# Patient Record
Sex: Female | Born: 1955
Health system: Southern US, Community
[De-identification: ages and names within clinical notes are randomized; demographics above are authoritative.]

## PROBLEM LIST (undated history)

## (undated) DIAGNOSIS — Z9889 Other specified postprocedural states: Secondary | ICD-10-CM

## (undated) DIAGNOSIS — N6019 Diffuse cystic mastopathy of unspecified breast: Secondary | ICD-10-CM

## (undated) DIAGNOSIS — M1990 Unspecified osteoarthritis, unspecified site: Secondary | ICD-10-CM

## (undated) DIAGNOSIS — E119 Type 2 diabetes mellitus without complications: Secondary | ICD-10-CM

## (undated) DIAGNOSIS — I219 Acute myocardial infarction, unspecified: Secondary | ICD-10-CM

## (undated) DIAGNOSIS — R112 Nausea with vomiting, unspecified: Secondary | ICD-10-CM

## (undated) DIAGNOSIS — E079 Disorder of thyroid, unspecified: Secondary | ICD-10-CM

## (undated) HISTORY — DX: Unspecified osteoarthritis, unspecified site: M19.90

## (undated) HISTORY — DX: Type 2 diabetes mellitus without complications: E11.9

## (undated) HISTORY — DX: Diffuse cystic mastopathy of unspecified breast: N60.19

## (undated) HISTORY — PX: UPPER GI ENDOSCOPY: SHX6162

## (undated) HISTORY — PX: CARPAL TUNNEL RELEASE: SHX101

## (undated) HISTORY — PX: EYE SURGERY: SHX253

## (undated) HISTORY — DX: Acute myocardial infarction, unspecified: I21.9

## (undated) HISTORY — DX: Disorder of thyroid, unspecified: E07.9

## (undated) HISTORY — PX: APPENDECTOMY: SHX54

## (undated) HISTORY — PX: SHOULDER SURGERY: SHX246

---

## 1990-01-24 HISTORY — PX: ABDOMINAL HYSTERECTOMY: SHX81

## 1990-01-24 HISTORY — PX: BREAST BIOPSY: SHX20

## 1993-01-24 HISTORY — PX: CORONARY ARTERY BYPASS GRAFT: SHX141

## 2003-12-11 ENCOUNTER — Ambulatory Visit: Payer: Self-pay | Admitting: Internal Medicine

## 2004-02-24 ENCOUNTER — Ambulatory Visit: Payer: Self-pay | Admitting: General Surgery

## 2004-03-17 ENCOUNTER — Ambulatory Visit: Payer: Self-pay | Admitting: Internal Medicine

## 2004-07-13 ENCOUNTER — Ambulatory Visit: Payer: Self-pay | Admitting: Internal Medicine

## 2004-10-06 ENCOUNTER — Ambulatory Visit: Payer: Self-pay | Admitting: Internal Medicine

## 2004-12-29 ENCOUNTER — Ambulatory Visit: Payer: Self-pay | Admitting: Internal Medicine

## 2005-03-29 ENCOUNTER — Ambulatory Visit: Payer: Self-pay | Admitting: Internal Medicine

## 2005-03-29 ENCOUNTER — Ambulatory Visit: Payer: Self-pay | Admitting: General Surgery

## 2006-01-24 DIAGNOSIS — E079 Disorder of thyroid, unspecified: Secondary | ICD-10-CM

## 2006-01-24 HISTORY — DX: Disorder of thyroid, unspecified: E07.9

## 2006-04-04 ENCOUNTER — Ambulatory Visit: Payer: Self-pay | Admitting: Specialist

## 2006-04-04 ENCOUNTER — Ambulatory Visit: Payer: Self-pay | Admitting: General Surgery

## 2007-04-16 ENCOUNTER — Ambulatory Visit: Payer: Self-pay | Admitting: General Surgery

## 2007-11-28 ENCOUNTER — Ambulatory Visit: Payer: Self-pay | Admitting: Specialist

## 2008-01-07 ENCOUNTER — Encounter: Admission: RE | Admit: 2008-01-07 | Discharge: 2008-01-07 | Payer: Self-pay | Admitting: Neurological Surgery

## 2008-01-25 HISTORY — PX: CARDIAC CATHETERIZATION: SHX172

## 2008-01-25 HISTORY — PX: OTHER SURGICAL HISTORY: SHX169

## 2008-01-25 HISTORY — PX: COLONOSCOPY: SHX174

## 2008-01-31 ENCOUNTER — Inpatient Hospital Stay (HOSPITAL_COMMUNITY): Admission: RE | Admit: 2008-01-31 | Discharge: 2008-02-04 | Payer: Self-pay | Admitting: Neurological Surgery

## 2008-02-26 ENCOUNTER — Encounter: Admission: RE | Admit: 2008-02-26 | Discharge: 2008-02-26 | Payer: Self-pay | Admitting: Neurological Surgery

## 2008-03-14 ENCOUNTER — Ambulatory Visit (HOSPITAL_COMMUNITY): Admission: RE | Admit: 2008-03-14 | Discharge: 2008-03-15 | Payer: Self-pay | Admitting: Neurological Surgery

## 2008-04-18 ENCOUNTER — Ambulatory Visit: Payer: Self-pay | Admitting: General Surgery

## 2008-05-06 ENCOUNTER — Encounter: Admission: RE | Admit: 2008-05-06 | Discharge: 2008-05-06 | Payer: Self-pay | Admitting: Neurological Surgery

## 2008-05-23 ENCOUNTER — Ambulatory Visit: Payer: Self-pay | Admitting: Cardiovascular Disease

## 2008-08-04 ENCOUNTER — Encounter: Admission: RE | Admit: 2008-08-04 | Discharge: 2008-08-04 | Payer: Self-pay | Admitting: Neurological Surgery

## 2008-11-04 ENCOUNTER — Encounter: Admission: RE | Admit: 2008-11-04 | Discharge: 2008-11-04 | Payer: Self-pay | Admitting: Neurological Surgery

## 2008-12-26 ENCOUNTER — Encounter: Admission: RE | Admit: 2008-12-26 | Discharge: 2008-12-26 | Payer: Self-pay | Admitting: Neurological Surgery

## 2009-01-24 HISTORY — PX: OTHER SURGICAL HISTORY: SHX169

## 2009-01-29 ENCOUNTER — Ambulatory Visit (HOSPITAL_COMMUNITY): Admission: RE | Admit: 2009-01-29 | Discharge: 2009-01-30 | Payer: Self-pay | Admitting: Neurological Surgery

## 2009-01-29 ENCOUNTER — Encounter (INDEPENDENT_AMBULATORY_CARE_PROVIDER_SITE_OTHER): Payer: Self-pay | Admitting: Neurological Surgery

## 2009-05-01 ENCOUNTER — Ambulatory Visit: Payer: Self-pay | Admitting: General Surgery

## 2009-06-20 IMAGING — CR DG LUMBAR SPINE 2-3V
3 series · 3 of 3 positions shown · non-contrast
Comparison: 01/31/2008

CLINICAL DATA: Lumbar fusion

LUMBAR SPINE - 2-3 VIEW

[view not recorded (1 of 3)]
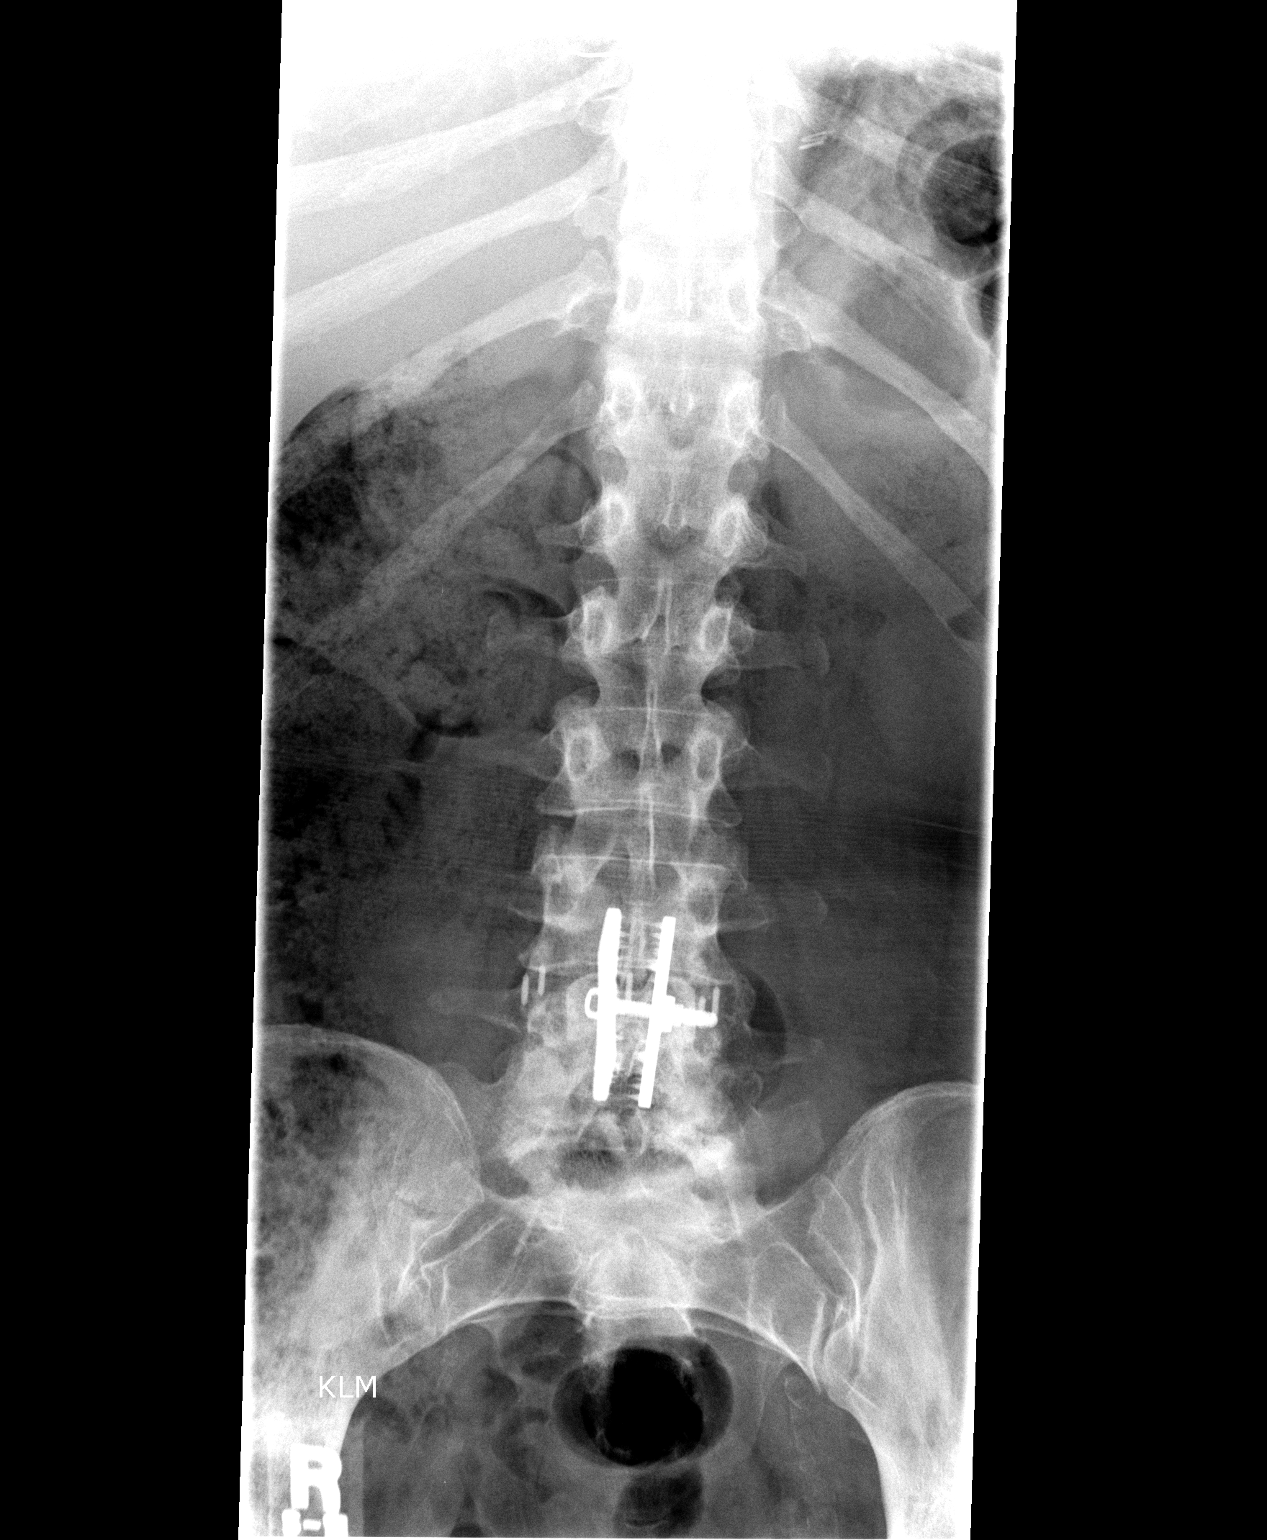

[view not recorded (2 of 3)]
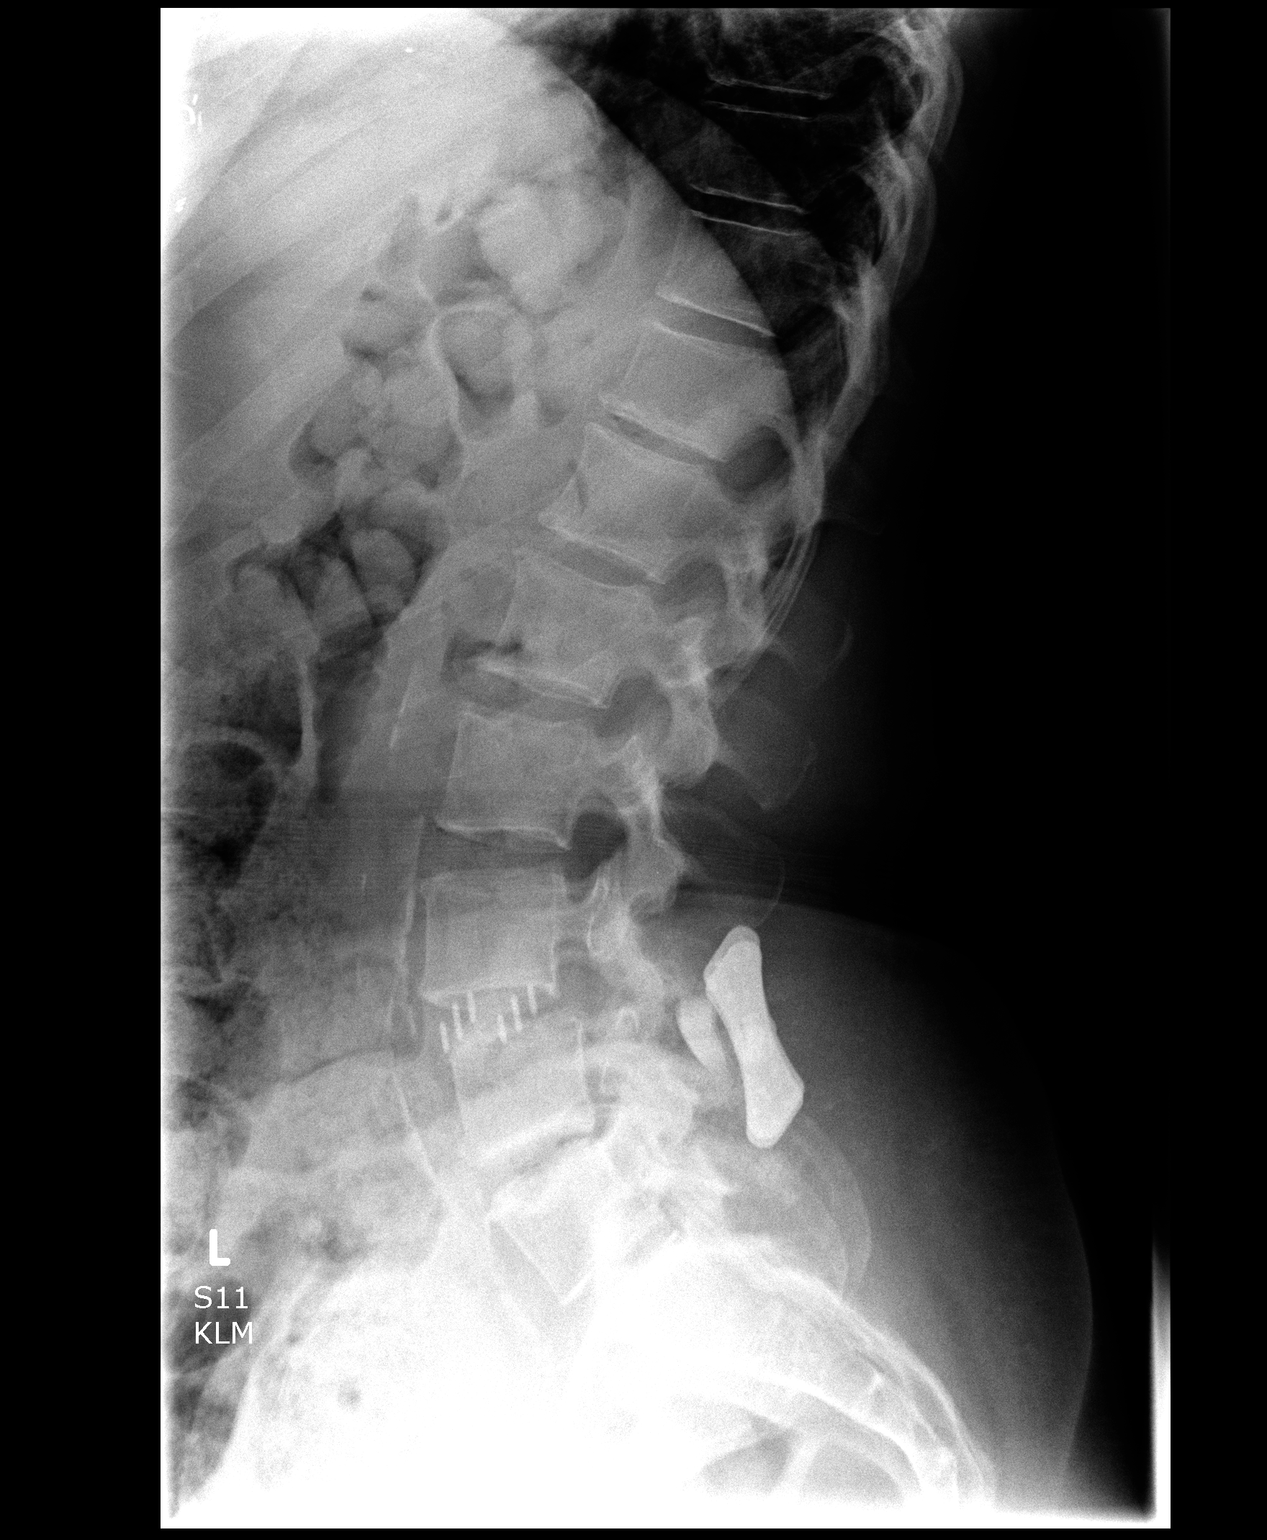

[view not recorded (3 of 3)]
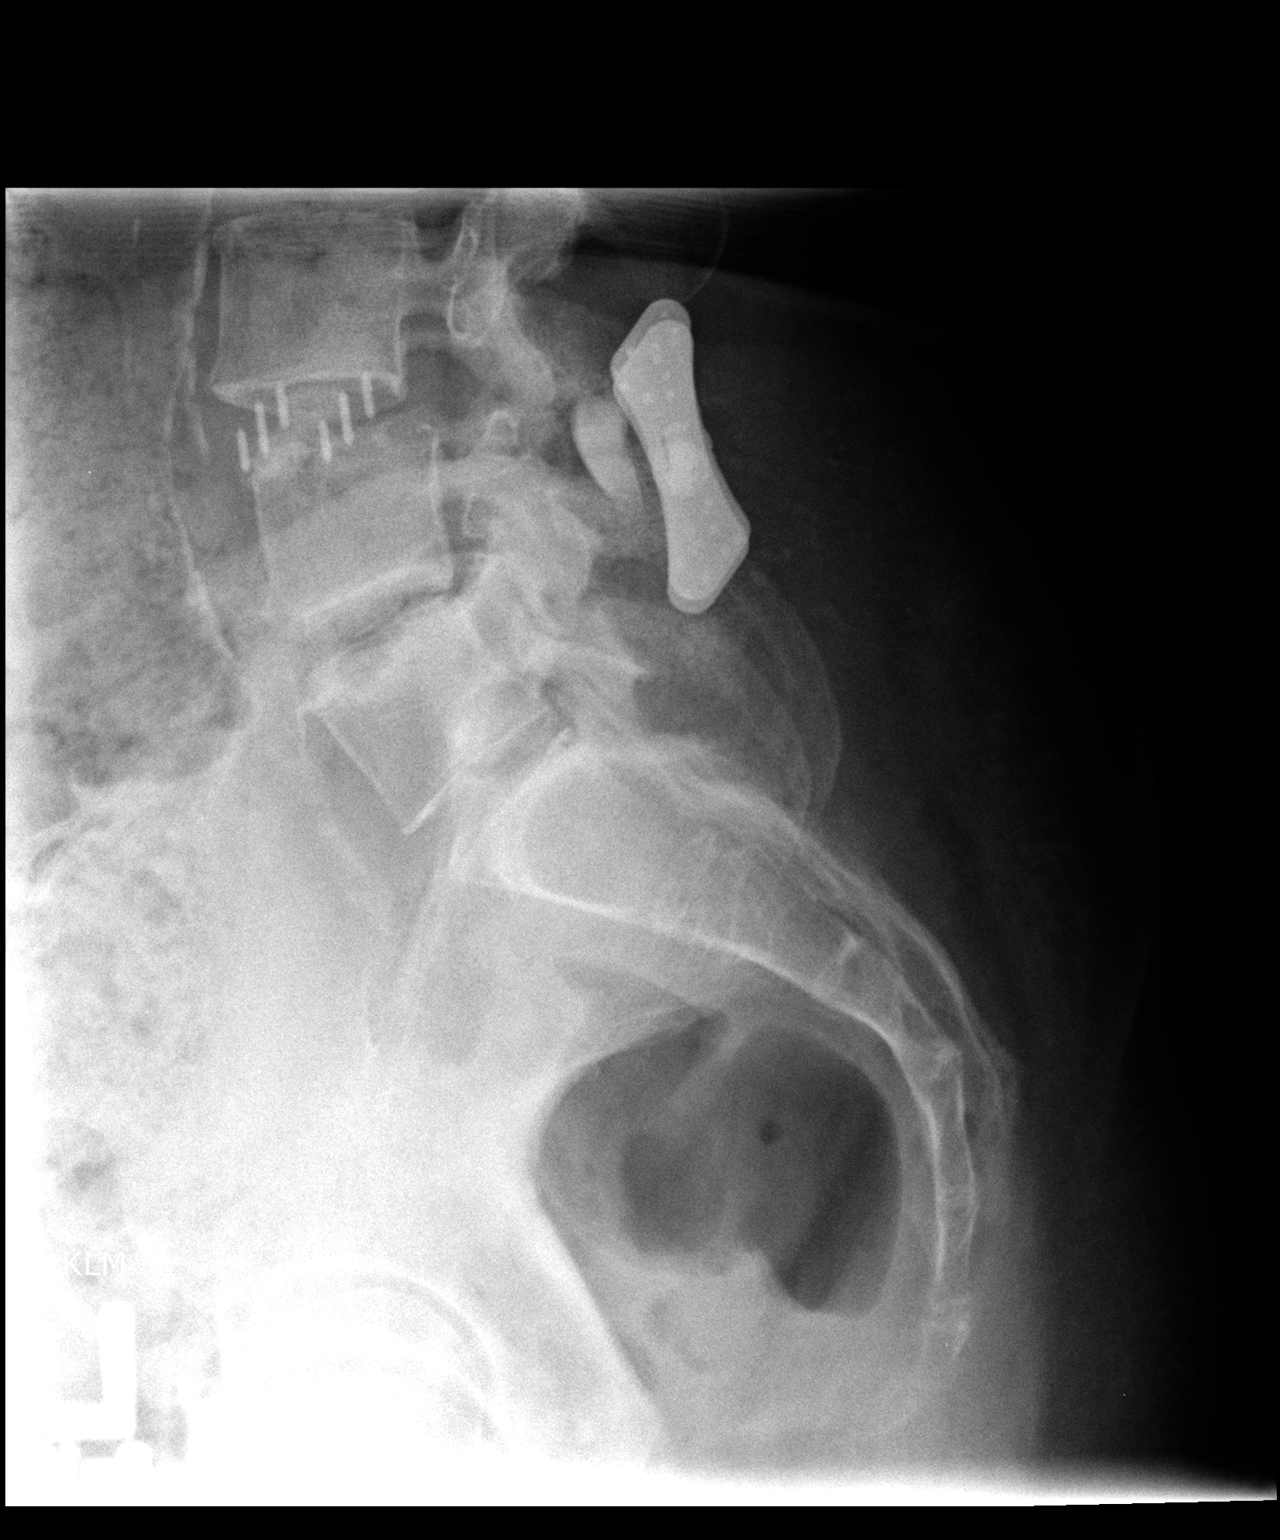

[3 of 3 positions shown; findings below may reference images not displayed]

FINDINGS: X-stop device is present between the spinous processes at
L4-5. Interbody fusion material appears satisfactory position at L3-
4.  Minimal anterolisthesis of L3 on L4.
IMPRESSION: Satisfactory position and alignment of the X-stop device and
interbody fusion material/spacers.

REF:G3 DICTATED: 02/26/2008 [DATE]

## 2010-04-11 LAB — BASIC METABOLIC PANEL
Calcium: 9.8 mg/dL (ref 8.4–10.5)
Chloride: 103 mEq/L (ref 96–112)
Creatinine, Ser: 1.14 mg/dL (ref 0.4–1.2)
GFR calc non Af Amer: 49 mL/min — ABNORMAL LOW (ref >60)
Glucose, Bld: 161 mg/dL — ABNORMAL HIGH (ref 70–99)
Potassium: 5.4 mEq/L — ABNORMAL HIGH (ref 3.5–5.1)

## 2010-04-11 LAB — CBC
RBC: 3.88 MIL/uL (ref 3.87–5.11)
RDW: 13.7 % (ref 11.5–15.5)
WBC: 6.6 10*3/uL (ref 4.0–10.5)

## 2010-04-11 LAB — DIFFERENTIAL
Basophils Relative: 1 % (ref 0–1)
Eosinophils Relative: 3 % (ref 0–5)
Lymphocytes Relative: 25 % (ref 12–46)
Lymphs Abs: 1.6 10*3/uL (ref 0.7–4.0)
Monocytes Relative: 8 % (ref 3–12)

## 2010-04-11 LAB — GLUCOSE, CAPILLARY
Glucose-Capillary: 245 mg/dL — ABNORMAL HIGH (ref 70–99)
Glucose-Capillary: 287 mg/dL — ABNORMAL HIGH (ref 70–99)
Glucose-Capillary: 291 mg/dL — ABNORMAL HIGH (ref 70–99)

## 2010-05-10 LAB — GLUCOSE, CAPILLARY
Glucose-Capillary: 171 mg/dL — ABNORMAL HIGH (ref 70–99)
Glucose-Capillary: 174 mg/dL — ABNORMAL HIGH (ref 70–99)
Glucose-Capillary: 183 mg/dL — ABNORMAL HIGH (ref 70–99)
Glucose-Capillary: 239 mg/dL — ABNORMAL HIGH (ref 70–99)
Glucose-Capillary: 240 mg/dL — ABNORMAL HIGH (ref 70–99)
Glucose-Capillary: 246 mg/dL — ABNORMAL HIGH (ref 70–99)
Glucose-Capillary: 278 mg/dL — ABNORMAL HIGH (ref 70–99)
Glucose-Capillary: 320 mg/dL — ABNORMAL HIGH (ref 70–99)

## 2010-05-10 LAB — POCT I-STAT 4, (NA,K, GLUC, HGB,HCT)
HCT: 38 % (ref 36.0–46.0)
Potassium: 5 mEq/L (ref 3.5–5.1)
Potassium: 5.2 mEq/L — ABNORMAL HIGH (ref 3.5–5.1)
Sodium: 134 mEq/L — ABNORMAL LOW (ref 135–145)

## 2010-05-10 LAB — ABO/RH: ABO/RH(D): O POS

## 2010-05-10 LAB — TYPE AND SCREEN: Antibody Screen: NEGATIVE

## 2010-05-11 LAB — CBC
HCT: 32.5 % — ABNORMAL LOW (ref 36.0–46.0)
Hemoglobin: 11.1 g/dL — ABNORMAL LOW (ref 12.0–15.0)
RBC: 3.48 MIL/uL — ABNORMAL LOW (ref 3.87–5.11)
WBC: 6.4 10*3/uL (ref 4.0–10.5)

## 2010-05-11 LAB — BASIC METABOLIC PANEL
GFR calc Af Amer: >60 mL/min (ref >60)
GFR calc non Af Amer: >60 mL/min (ref >60)
Glucose, Bld: 98 mg/dL (ref 70–99)
Potassium: 4.7 mEq/L (ref 3.5–5.1)
Sodium: 136 mEq/L (ref 135–145)

## 2010-05-11 LAB — WOUND CULTURE

## 2010-05-11 LAB — GLUCOSE, CAPILLARY
Glucose-Capillary: 115 mg/dL — ABNORMAL HIGH (ref 70–99)
Glucose-Capillary: 232 mg/dL — ABNORMAL HIGH (ref 70–99)
Glucose-Capillary: 283 mg/dL — ABNORMAL HIGH (ref 70–99)

## 2010-05-11 LAB — DIFFERENTIAL
Eosinophils Relative: 4 % (ref 0–5)
Lymphocytes Relative: 24 % (ref 12–46)
Lymphs Abs: 1.5 10*3/uL (ref 0.7–4.0)
Monocytes Absolute: 0.5 10*3/uL (ref 0.1–1.0)
Monocytes Relative: 8 % (ref 3–12)
Neutro Abs: 4.1 10*3/uL (ref 1.7–7.7)

## 2010-05-11 LAB — ANAEROBIC CULTURE

## 2010-06-08 NOTE — Op Note (Signed)
NAMEMIHO, MONDA NO.:  [id number]   MEDICAL RECORD NO.:  [id number]          PATIENT TYPE:  INP   LOCATION:  3172                         FACILITY:  [facility]   PHYSICIAN:  Tia Alert, MD     DATE OF BIRTH:  [birth year]   DATE OF PROCEDURE:  01/31/2008  DATE OF DISCHARGE:                               OPERATIVE REPORT   PREOPERATIVE DIAGNOSES:  1. Spondylolisthesis, L3-L4.  2. Facet arthropathy, L3-L4.  3. Lumbar spinal stenosis, L3-L4 with back and right L4 radiculopathy.  4. Degenerative disk disease, L4-L5.   POSTOPERATIVE DIAGNOSES:  1. Spondylolisthesis, L3-L4.  2. Facet arthropathy, L3-L4.  3. Lumbar spinal stenosis, L3-L4 with back and right L4 radiculopathy.  4. Degenerative disk disease, L4-L5.   PROCEDURES:  1. Anterior retroperitoneal interbody fusion, L3-L4 utilizing an 8 x      45 mm PEEK interbody cage packed with Osteocel Plus.  2. Posterior lumbar interlaminar fusion, L3-L4, L4-L5 utilizing local      autograft, Actifuse putty and Osteocel Plus.  3. Interspinous plating, L3-L4 utilizing a NuVasive plate.   SURGEON:  Tia Alert, MD   ASSISTANT:  Reinaldo Meeker, MD   ANESTHESIA:  General endotracheal.   COMPLICATIONS:  None apparent.   INDICATIONS FOR PROCEDURE:  Gail Franco is a very pleasant 55 year old  female who presented with back pain with left leg pain consistent with  an L4 radiculopathy.  She had significant facet arthropathy at L3-L4  with spinal stenosis at each level.  She had degenerative disk disease  at L4-L5.  I recommended anterior retroperitoneal interbody fusion at L3-  L4 and at L4-L5.  However, prior to the procedure, I felt that her  anatomy was not favorable for the interbody fusion at L4-L5 because of  the vasculature and because of her iliac crest.  Therefore, I  recommended a interbody fusion at L3-L4 followed by posterior  instrumentation to augment the fusion with onlay graft.  She understood  the  risks, benefits, expected outcome, and wished to proceed.   DESCRIPTION OF PROCEDURE:  The patient was taken to the operating room  and after induction of adequate generalized endotracheal anesthesia, she  was placed in a lateral decubitus position and taped into position.  We  checked this with AP and lateral fluoroscopy and then prepped her left  side and back with DuraPrep and draped in the usual sterile fashion.  We  used a lateral fluoroscopy to mark the disk space and then made two  incisions to approach the spine from the lateral trajectory.  We used  finger dissection through the more medial incision to go through the  outer fascia, the oblique musculature, then the inner fascia into the  retroperitoneal space.  We then swept this space until we could feel the  rib above and the transverse process.  We then used our first dilator  and passed it with our finger down to the psoas musculature and then  connected this to continuous EMG monitoring.  We had excellent readings  with that, all greater than 20 mA.  We  passed the first dilator through  the psoas musculature and checked this with AP and lateral fluoroscopy.  We then used sequential dilation continuing the use, continuous EMG  monitoring until our final retractor was placed.  We then opened the  final retractor to expose the disk space laterally.  We then continued  EMG monitoring to assure there were no neurovascular structures in our  exposed area.  A shim was then tapped into position and the disk space  was incised and the initial diskectomy was done with pituitary rongeurs.  I then used Cobbs, rotating cutter, and scrapers to prepare the  endplates.  The annulus was released on the opposite side and then an 8-  mm trial was used.  We felt this was adequate.  We tried the 10 mm, but  it seemed to be over-distracting the disk space.  So, we stayed with the  8 mm and used an 8 mm x 45 mm PEEK interbody cage.  We packed this  with  Osteocel Plus and tapped this in position at L3-L4.  We then checked  this with AP and lateral fluoroscopy and it appeared to be in good  position.  Therefore, the retractor was removed.  The wound was  irrigated and the wounds were closed with layers of 2-0 and 3-0 Vicryl  and then the skin was closed with benzoin and Steri-Strips.  All of the  drapes were removed.  We then rolled the patient into the prone position  and her lumbar region was prepped with DuraPrep and draped in usual  sterile fashion.  A 5 mL of local anesthesia was injected and a dorsal  midline incision was made and carried down to the lumbosacral fascia.  The fascia was opened and the paraspinous musculature was taken down in  a subperiosteal fashion to expose L3-L4 and L4-L5.  I removed the  interspinous ligament at L3-L4.  This had already been disrupted by her  disease process.  I performed an intertransverse fusion with a H2  allograft and then decorticated the lamina of L3, L4, and L5 and placed  a mixture of local autograft Actifuse and Osteocel Plus to perform an  interlaminar fusion at L3-L5.  I then placed an interspinous plate at L3-  L4.  I wanted to stack a plate at Z6-X0, but her spinous processes were  small and therefore the surface area of the spinous process would not  take two plates.  Therefore, I tightened the first plate at R6-E4.  We  checked this with AP and lateral fluoroscopy.  It appeared to be in good  position.  Therefore, I irrigated with saline solution and then closed  the fascia with 0-Vicryl, closed the subcutaneous and subcuticular  tissue with 2-0 and 3-0 Vicryl, and closed the skin with benzoin and  Steri-Strips.  The drapes were removed.  A sterile dressing was applied.  The patient was awakened from general anesthesia and transported to the  recovery room in stable condition.  At the end of the procedure, all  sponge, needle, and instrument counts were correct.       Tia Alert, MD  Electronically Signed     DSJ/MEDQ  D:  01/31/2008  T:  02/01/2008  Job:  [id number]

## 2010-06-08 NOTE — Op Note (Signed)
NAMESHARIAH, ASSAD NO.:  [id number]   MEDICAL RECORD NO.:  [id number]          PATIENT TYPE:  OIB   LOCATION:  3526                         FACILITY:  [facility]   PHYSICIAN:  Tia Alert, MD     DATE OF BIRTH:  [birth year]   DATE OF PROCEDURE:  03/14/2008  DATE OF DISCHARGE:                               OPERATIVE REPORT   PREOPERATIVE DIAGNOSIS:  Superficial wound infection.   POSTOPERATIVE DIAGNOSIS:  Superficial wound infection.   PROCEDURE:  Incision and drainage with irrigation and debridement of  lumbar wound.   SURGEON:  Tia Alert, MD   ANESTHESIA:  General endotracheal.   COMPLICATIONS:  None apparent.   INDICATIONS FOR THE PROCEDURE:  Ms. Shipp is a very pleasant 55-year-  old female who underwent a lumbar spine surgery about a month ago.  She  had some early drainage from her wound, was felt to potentially have a  superficial lumbar wound infection, was treated with doxycycline with  the drainage subsiding.  The wound looked fine about 2 days prior to her  calling me saying that she had some drainage and dehiscence of the  superficial and superior part of her wound.  Therefore, she was admitted  for irrigation, debridement and revision of the wound.  She understood  the risks, benefits, and expected outcome and wished to proceed.   DESCRIPTION OF THE PROCEDURE:  The patient was taken to the operating  room and after induction of adequate generalized endotracheal anesthesia  she was rolled into prone position on the Wilson frame and all pressure  points were padded.  Her lumbar region was prepped with DuraPrep and  draped in usual sterile fashion.  She had a soft swelling at the  superior aspect of the wound with minor dehiscence and some weeping of  fluid.  This was ellipsed out.  There was some release of purulent  drainage in the very superficial subcutaneous tissues.  These did not  appear to go subfascially.  I opened the wound about  3 inches long.  I  debrided the tissues, irrigated it with saline solution containing  bacitracin.  The wound was clean, had nice bleeding edges, did not look  grossly infected and therefore I closed the subcutaneous tissues with 2-  0 Vicryl and the subcuticular tissues with 3-0 Vicryl.  A medium Hemovac  drain was used in the suprafascial space.  The skin was closed with  benzoin and Steri-Strips.  The drapes removed.  Sterile dressing was  applied.  The patient was awakened from general anesthesia and  transferred to the recovery room in stable condition.  At the end of the  procedure, all sponge, needle and instrument counts were correct.      Tia Alert, MD  Electronically Signed    DSJ/MEDQ  D:  03/14/2008  T:  03/15/2008  Job:  [id number]

## 2010-06-11 NOTE — Discharge Summary (Signed)
NAMEEMORI, MUMME NO.:  [id number]   MEDICAL RECORD NO.:  [id number]          PATIENT TYPE:  INP   LOCATION:  3025                         FACILITY:  [facility]   PHYSICIAN:  Tia Alert, MD     DATE OF BIRTH:  13-Aug-1955   DATE OF ADMISSION:  01/31/2008  DATE OF DISCHARGE:  02/04/2008                               DISCHARGE SUMMARY   ADMITTING DIAGNOSIS:  Degenerative disease L3-4 and L4-5 with  spondylolisthesis and spinal stenosis with back and leg pain.   PROCEDURE:  Anterior retroperitoneal interbody fusion L3-4 with  interspinous plating and only fusion L3-L5 posteriorly.   BRIEF HISTORY OF PRESENT ILLNESS:  Ms. Salak is a very pleasant 55-year-  old female who presented with severe back pain with leg pain.  She had  an MRI and plain films which showed degenerative disease at L3-4 and L4-  5 with a grade 1 anterior listhesis of L3 and L4 with some lateral  recess stenosis at L3-4 and L4-5.  I recommended anterior  retroperitoneal interbody fusion with instrumentation.  She understood  the risks, benefits, expected outcome, and wished to proceed.   HOSPITAL COURSE:  The patient was admitted on January 31, 2008, and taken  to the operating room where she underwent an anterior retroperitoneal  interbody fusion L3-4 with interspinous plating and only fusion at L3-L5  posteriorly.  The patient tolerated the procedure well and was taken to  the recovery room and then to the floor in stable condition.  For  details of the operative procedure, please see the dictated operative  note.  The patient's hospital course was fairly routine.  There were no  complications.  She remained afebrile with stable vital signs.  Her  wounds remained clean, dry, and intact.  She was a little slow to  mobilize from soreness in her back.  She did have some anterior thigh  numbness and weakness which is common after this procedure, but she was  ambulating in the halls without  difficulty.  Her pain became well  controlled off the PCA after about 72 hours.  She remained afebrile with  stable vital signs and tolerated regular diet.  She was discharged to  home in stable condition on February 04, 2008, with plans to follow up in  2 weeks.   FINAL DIAGNOSIS:  Anterior retroperitoneal interbody fusion L3-4.      Tia Alert, MD  Electronically Signed     DSJ/MEDQ  D:  04/09/2008  T:  04/10/2008  Job:  [id number]

## 2010-07-08 ENCOUNTER — Ambulatory Visit: Payer: Self-pay | Admitting: General Surgery

## 2010-10-29 LAB — BASIC METABOLIC PANEL
CO2: 27 mEq/L (ref 19–32)
Chloride: 103 mEq/L (ref 96–112)
Creatinine, Ser: 1.16 mg/dL (ref 0.4–1.2)
GFR calc Af Amer: 59 mL/min — ABNORMAL LOW (ref >60)
Potassium: 4.5 mEq/L (ref 3.5–5.1)

## 2010-10-29 LAB — DIFFERENTIAL
Basophils Relative: 0 % (ref 0–1)
Eosinophils Absolute: 0.2 10*3/uL (ref 0.0–0.7)
Eosinophils Relative: 4 % (ref 0–5)
Lymphs Abs: 1.7 10*3/uL (ref 0.7–4.0)
Monocytes Absolute: 0.6 10*3/uL (ref 0.1–1.0)
Monocytes Relative: 9 % (ref 3–12)
Neutrophils Relative %: 62 % (ref 43–77)

## 2010-10-29 LAB — CBC
HCT: 39 % (ref 36.0–46.0)
MCHC: 33.5 g/dL (ref 30.0–36.0)
MCV: 96.4 fL (ref 78.0–100.0)
RBC: 4.05 MIL/uL (ref 3.87–5.11)

## 2011-01-28 DIAGNOSIS — N183 Chronic kidney disease, stage 3 unspecified: Secondary | ICD-10-CM | POA: Diagnosis not present

## 2011-01-28 DIAGNOSIS — D649 Anemia, unspecified: Secondary | ICD-10-CM | POA: Diagnosis not present

## 2011-01-28 DIAGNOSIS — E7800 Pure hypercholesterolemia, unspecified: Secondary | ICD-10-CM | POA: Diagnosis not present

## 2011-01-28 DIAGNOSIS — G609 Hereditary and idiopathic neuropathy, unspecified: Secondary | ICD-10-CM | POA: Diagnosis not present

## 2011-01-28 DIAGNOSIS — Z79899 Other long term (current) drug therapy: Secondary | ICD-10-CM | POA: Diagnosis not present

## 2011-01-28 DIAGNOSIS — R809 Proteinuria, unspecified: Secondary | ICD-10-CM | POA: Diagnosis not present

## 2011-01-28 DIAGNOSIS — E109 Type 1 diabetes mellitus without complications: Secondary | ICD-10-CM | POA: Diagnosis not present

## 2011-01-28 DIAGNOSIS — Z794 Long term (current) use of insulin: Secondary | ICD-10-CM | POA: Diagnosis not present

## 2011-01-28 DIAGNOSIS — I2510 Atherosclerotic heart disease of native coronary artery without angina pectoris: Secondary | ICD-10-CM | POA: Diagnosis not present

## 2011-03-11 DIAGNOSIS — H2510 Age-related nuclear cataract, unspecified eye: Secondary | ICD-10-CM | POA: Diagnosis not present

## 2011-03-16 DIAGNOSIS — H2510 Age-related nuclear cataract, unspecified eye: Secondary | ICD-10-CM | POA: Diagnosis not present

## 2011-04-05 DIAGNOSIS — Z951 Presence of aortocoronary bypass graft: Secondary | ICD-10-CM | POA: Diagnosis not present

## 2011-04-05 DIAGNOSIS — E11359 Type 2 diabetes mellitus with proliferative diabetic retinopathy without macular edema: Secondary | ICD-10-CM | POA: Diagnosis not present

## 2011-04-05 DIAGNOSIS — E1139 Type 2 diabetes mellitus with other diabetic ophthalmic complication: Secondary | ICD-10-CM | POA: Diagnosis not present

## 2011-04-05 DIAGNOSIS — H539 Unspecified visual disturbance: Secondary | ICD-10-CM | POA: Diagnosis not present

## 2011-04-05 DIAGNOSIS — Z79899 Other long term (current) drug therapy: Secondary | ICD-10-CM | POA: Diagnosis not present

## 2011-04-05 DIAGNOSIS — I2510 Atherosclerotic heart disease of native coronary artery without angina pectoris: Secondary | ICD-10-CM | POA: Diagnosis not present

## 2011-04-05 DIAGNOSIS — E7800 Pure hypercholesterolemia, unspecified: Secondary | ICD-10-CM | POA: Diagnosis not present

## 2011-04-05 DIAGNOSIS — H26019 Infantile and juvenile cortical, lamellar, or zonular cataract, unspecified eye: Secondary | ICD-10-CM | POA: Diagnosis not present

## 2011-04-05 DIAGNOSIS — K219 Gastro-esophageal reflux disease without esophagitis: Secondary | ICD-10-CM | POA: Diagnosis not present

## 2011-04-05 DIAGNOSIS — H2510 Age-related nuclear cataract, unspecified eye: Secondary | ICD-10-CM | POA: Diagnosis not present

## 2011-04-05 DIAGNOSIS — H259 Unspecified age-related cataract: Secondary | ICD-10-CM | POA: Diagnosis not present

## 2011-04-15 DIAGNOSIS — E119 Type 2 diabetes mellitus without complications: Secondary | ICD-10-CM | POA: Diagnosis not present

## 2011-04-15 DIAGNOSIS — I10 Essential (primary) hypertension: Secondary | ICD-10-CM | POA: Diagnosis not present

## 2011-05-19 DIAGNOSIS — E119 Type 2 diabetes mellitus without complications: Secondary | ICD-10-CM | POA: Diagnosis not present

## 2011-05-25 DIAGNOSIS — E109 Type 1 diabetes mellitus without complications: Secondary | ICD-10-CM | POA: Diagnosis not present

## 2011-05-25 DIAGNOSIS — G609 Hereditary and idiopathic neuropathy, unspecified: Secondary | ICD-10-CM | POA: Diagnosis not present

## 2011-05-25 DIAGNOSIS — E11359 Type 2 diabetes mellitus with proliferative diabetic retinopathy without macular edema: Secondary | ICD-10-CM | POA: Diagnosis not present

## 2011-05-25 DIAGNOSIS — E1039 Type 1 diabetes mellitus with other diabetic ophthalmic complication: Secondary | ICD-10-CM | POA: Diagnosis not present

## 2011-05-25 DIAGNOSIS — Z794 Long term (current) use of insulin: Secondary | ICD-10-CM | POA: Diagnosis not present

## 2011-05-25 DIAGNOSIS — Z951 Presence of aortocoronary bypass graft: Secondary | ICD-10-CM | POA: Diagnosis not present

## 2011-05-25 DIAGNOSIS — Z79899 Other long term (current) drug therapy: Secondary | ICD-10-CM | POA: Diagnosis not present

## 2011-05-25 DIAGNOSIS — I2510 Atherosclerotic heart disease of native coronary artery without angina pectoris: Secondary | ICD-10-CM | POA: Diagnosis not present

## 2011-05-25 DIAGNOSIS — R809 Proteinuria, unspecified: Secondary | ICD-10-CM | POA: Diagnosis not present

## 2011-05-30 DIAGNOSIS — E7800 Pure hypercholesterolemia, unspecified: Secondary | ICD-10-CM | POA: Diagnosis not present

## 2011-05-30 DIAGNOSIS — E060 Acute thyroiditis: Secondary | ICD-10-CM | POA: Diagnosis not present

## 2011-07-13 ENCOUNTER — Ambulatory Visit: Payer: Self-pay | Admitting: General Surgery

## 2011-07-13 DIAGNOSIS — I209 Angina pectoris, unspecified: Secondary | ICD-10-CM | POA: Diagnosis not present

## 2011-07-13 DIAGNOSIS — N6459 Other signs and symptoms in breast: Secondary | ICD-10-CM | POA: Diagnosis not present

## 2011-07-13 DIAGNOSIS — Z1231 Encounter for screening mammogram for malignant neoplasm of breast: Secondary | ICD-10-CM | POA: Diagnosis not present

## 2011-07-13 DIAGNOSIS — I5033 Acute on chronic diastolic (congestive) heart failure: Secondary | ICD-10-CM | POA: Diagnosis not present

## 2011-07-13 DIAGNOSIS — N6019 Diffuse cystic mastopathy of unspecified breast: Secondary | ICD-10-CM | POA: Diagnosis not present

## 2011-07-13 DIAGNOSIS — I060 Rheumatic aortic stenosis: Secondary | ICD-10-CM | POA: Diagnosis not present

## 2011-07-13 DIAGNOSIS — N6489 Other specified disorders of breast: Secondary | ICD-10-CM | POA: Diagnosis not present

## 2011-08-11 DIAGNOSIS — R55 Syncope and collapse: Secondary | ICD-10-CM | POA: Diagnosis not present

## 2011-08-12 DIAGNOSIS — I209 Angina pectoris, unspecified: Secondary | ICD-10-CM | POA: Diagnosis not present

## 2011-08-12 DIAGNOSIS — R079 Chest pain, unspecified: Secondary | ICD-10-CM | POA: Diagnosis not present

## 2011-10-06 DIAGNOSIS — E11359 Type 2 diabetes mellitus with proliferative diabetic retinopathy without macular edema: Secondary | ICD-10-CM | POA: Diagnosis not present

## 2011-10-06 DIAGNOSIS — H31009 Unspecified chorioretinal scars, unspecified eye: Secondary | ICD-10-CM | POA: Diagnosis not present

## 2011-10-06 DIAGNOSIS — E1139 Type 2 diabetes mellitus with other diabetic ophthalmic complication: Secondary | ICD-10-CM | POA: Diagnosis not present

## 2011-10-06 DIAGNOSIS — H35049 Retinal micro-aneurysms, unspecified, unspecified eye: Secondary | ICD-10-CM | POA: Diagnosis not present

## 2011-11-11 DIAGNOSIS — E119 Type 2 diabetes mellitus without complications: Secondary | ICD-10-CM | POA: Diagnosis not present

## 2011-11-11 DIAGNOSIS — E109 Type 1 diabetes mellitus without complications: Secondary | ICD-10-CM | POA: Diagnosis not present

## 2011-11-11 DIAGNOSIS — E559 Vitamin D deficiency, unspecified: Secondary | ICD-10-CM | POA: Diagnosis not present

## 2011-11-11 DIAGNOSIS — I209 Angina pectoris, unspecified: Secondary | ICD-10-CM | POA: Diagnosis not present

## 2011-12-09 DIAGNOSIS — N183 Chronic kidney disease, stage 3 unspecified: Secondary | ICD-10-CM | POA: Diagnosis not present

## 2011-12-09 DIAGNOSIS — E0500 Thyrotoxicosis with diffuse goiter without thyrotoxic crisis or storm: Secondary | ICD-10-CM | POA: Diagnosis not present

## 2011-12-09 DIAGNOSIS — Z951 Presence of aortocoronary bypass graft: Secondary | ICD-10-CM | POA: Diagnosis not present

## 2011-12-09 DIAGNOSIS — E7800 Pure hypercholesterolemia, unspecified: Secondary | ICD-10-CM | POA: Diagnosis not present

## 2011-12-09 DIAGNOSIS — D649 Anemia, unspecified: Secondary | ICD-10-CM | POA: Diagnosis not present

## 2011-12-09 DIAGNOSIS — E1039 Type 1 diabetes mellitus with other diabetic ophthalmic complication: Secondary | ICD-10-CM | POA: Diagnosis not present

## 2011-12-09 DIAGNOSIS — Z794 Long term (current) use of insulin: Secondary | ICD-10-CM | POA: Diagnosis not present

## 2011-12-09 DIAGNOSIS — Z79899 Other long term (current) drug therapy: Secondary | ICD-10-CM | POA: Diagnosis not present

## 2011-12-09 DIAGNOSIS — G609 Hereditary and idiopathic neuropathy, unspecified: Secondary | ICD-10-CM | POA: Diagnosis not present

## 2011-12-09 DIAGNOSIS — E11359 Type 2 diabetes mellitus with proliferative diabetic retinopathy without macular edema: Secondary | ICD-10-CM | POA: Diagnosis not present

## 2011-12-09 DIAGNOSIS — Z23 Encounter for immunization: Secondary | ICD-10-CM | POA: Diagnosis not present

## 2011-12-09 DIAGNOSIS — E109 Type 1 diabetes mellitus without complications: Secondary | ICD-10-CM | POA: Diagnosis not present

## 2011-12-09 DIAGNOSIS — R809 Proteinuria, unspecified: Secondary | ICD-10-CM | POA: Diagnosis not present

## 2011-12-09 DIAGNOSIS — E079 Disorder of thyroid, unspecified: Secondary | ICD-10-CM | POA: Diagnosis not present

## 2011-12-09 DIAGNOSIS — I2510 Atherosclerotic heart disease of native coronary artery without angina pectoris: Secondary | ICD-10-CM | POA: Diagnosis not present

## 2012-01-27 DIAGNOSIS — E119 Type 2 diabetes mellitus without complications: Secondary | ICD-10-CM | POA: Diagnosis not present

## 2012-01-27 DIAGNOSIS — E1029 Type 1 diabetes mellitus with other diabetic kidney complication: Secondary | ICD-10-CM | POA: Diagnosis not present

## 2012-01-27 DIAGNOSIS — I5033 Acute on chronic diastolic (congestive) heart failure: Secondary | ICD-10-CM | POA: Diagnosis not present

## 2012-01-27 DIAGNOSIS — I060 Rheumatic aortic stenosis: Secondary | ICD-10-CM | POA: Diagnosis not present

## 2012-01-27 DIAGNOSIS — I209 Angina pectoris, unspecified: Secondary | ICD-10-CM | POA: Diagnosis not present

## 2012-04-05 DIAGNOSIS — H179 Unspecified corneal scar and opacity: Secondary | ICD-10-CM | POA: Diagnosis not present

## 2012-04-05 DIAGNOSIS — H31009 Unspecified chorioretinal scars, unspecified eye: Secondary | ICD-10-CM | POA: Diagnosis not present

## 2012-04-05 DIAGNOSIS — H35049 Retinal micro-aneurysms, unspecified, unspecified eye: Secondary | ICD-10-CM | POA: Diagnosis not present

## 2012-04-05 DIAGNOSIS — E11359 Type 2 diabetes mellitus with proliferative diabetic retinopathy without macular edema: Secondary | ICD-10-CM | POA: Diagnosis not present

## 2012-04-05 DIAGNOSIS — E1139 Type 2 diabetes mellitus with other diabetic ophthalmic complication: Secondary | ICD-10-CM | POA: Diagnosis not present

## 2012-05-04 DIAGNOSIS — D649 Anemia, unspecified: Secondary | ICD-10-CM | POA: Diagnosis not present

## 2012-05-04 DIAGNOSIS — I10 Essential (primary) hypertension: Secondary | ICD-10-CM | POA: Diagnosis not present

## 2012-05-04 DIAGNOSIS — E039 Hypothyroidism, unspecified: Secondary | ICD-10-CM | POA: Diagnosis not present

## 2012-05-04 DIAGNOSIS — E7800 Pure hypercholesterolemia, unspecified: Secondary | ICD-10-CM | POA: Diagnosis not present

## 2012-05-04 DIAGNOSIS — E785 Hyperlipidemia, unspecified: Secondary | ICD-10-CM | POA: Diagnosis not present

## 2012-05-04 DIAGNOSIS — E119 Type 2 diabetes mellitus without complications: Secondary | ICD-10-CM | POA: Diagnosis not present

## 2012-05-04 DIAGNOSIS — I209 Angina pectoris, unspecified: Secondary | ICD-10-CM | POA: Diagnosis not present

## 2012-05-11 ENCOUNTER — Encounter: Payer: Self-pay | Admitting: General Surgery

## 2012-05-23 DIAGNOSIS — E11359 Type 2 diabetes mellitus with proliferative diabetic retinopathy without macular edema: Secondary | ICD-10-CM | POA: Diagnosis not present

## 2012-05-23 DIAGNOSIS — E1139 Type 2 diabetes mellitus with other diabetic ophthalmic complication: Secondary | ICD-10-CM | POA: Diagnosis not present

## 2012-05-23 DIAGNOSIS — H2510 Age-related nuclear cataract, unspecified eye: Secondary | ICD-10-CM | POA: Diagnosis not present

## 2012-05-23 DIAGNOSIS — Z961 Presence of intraocular lens: Secondary | ICD-10-CM | POA: Diagnosis not present

## 2012-07-16 ENCOUNTER — Ambulatory Visit (INDEPENDENT_AMBULATORY_CARE_PROVIDER_SITE_OTHER): Payer: Medicare Other | Admitting: General Surgery

## 2012-07-16 ENCOUNTER — Encounter: Payer: Self-pay | Admitting: General Surgery

## 2012-07-16 ENCOUNTER — Ambulatory Visit: Payer: Self-pay | Admitting: General Surgery

## 2012-07-16 VITALS — BP 118/70 | HR 75 | Resp 14 | Ht 64.0 in | Wt 158.0 lb

## 2012-07-16 DIAGNOSIS — N6019 Diffuse cystic mastopathy of unspecified breast: Secondary | ICD-10-CM | POA: Diagnosis not present

## 2012-07-16 DIAGNOSIS — Z1231 Encounter for screening mammogram for malignant neoplasm of breast: Secondary | ICD-10-CM | POA: Diagnosis not present

## 2012-07-16 NOTE — Patient Instructions (Addendum)
Follow up one year bilateral scr mammo

## 2012-07-16 NOTE — Progress Notes (Signed)
Patient ID: Gail Franco, female   DOB: 26-Dec-1955, 57 y.o.   MRN: [id number]  Chief Complaint  Patient presents with  . Follow-up    mammogram    HPI Gail Franco is a 57 y.o. female here for follow up yearly mammogram done today at [facility]. Patient does self breast exams and gets  Yearly mammograms. No breast complaints at this time. Has history of FCD.  HPI  Past Medical History  Diagnosis Date  . Diffuse cystic mastopathy   . Arthritis   . Thyroid disease 2008    hyperactive  . Diabetes mellitus without complication   . Myocardial infarction     Past Surgical History  Procedure Laterality Date  . Colonoscopy  2010  . Other surgical history  2011    scar revision d/t fistula lumbar fusion  . Upper gi endoscopy  2010, 2011  . Cardiac catheterization  2010  . Lumbar fusions  2010  . Abdominal hysterectomy  1992  . Appendectomy    . Shoulder surgery    . Coronary artery bypass graft  1995  . Carpal tunnel release    . Breast biopsy  1992    History reviewed. No pertinent family history.  Social History History  Substance Use Topics  . Smoking status: Never Smoker   . Smokeless tobacco: Never Used  . Alcohol Use: No    Allergies  Allergen Reactions  . Other Itching    Steri strips     Current Outpatient Prescriptions  Medication Sig Dispense Refill  . acetaminophen (TYLENOL) 500 MG tablet Take 500 mg by mouth every 6 (six) hours as needed for pain.      Marland Kitchen atenolol (TENORMIN) 25 MG tablet Take 25 mg by mouth 2 (two) times daily.      . clopidogrel (PLAVIX) 75 MG tablet Take 75 mg by mouth daily.      . cyclobenzaprine (FLEXERIL) 10 MG tablet Take 10 mg by mouth daily.      Marland Kitchen FLUoxetine (PROZAC) 20 MG capsule Take by mouth daily.      Marland Kitchen HUMALOG 100 UNIT/ML injection       . LANTUS 100 UNIT/ML injection       . lisinopril (PRINIVIL,ZESTRIL) 2.5 MG tablet Take 2.5 mg by mouth daily.      . pantoprazole (PROTONIX) 40 MG tablet Take 40 mg by mouth daily.       Marland Kitchen topiramate (TOPAMAX) 50 MG tablet Take 50 mg by mouth 2 (two) times daily.       No current facility-administered medications for this visit.    Review of Systems Review of Systems  Constitutional: Negative.   Respiratory: Negative.   Cardiovascular: Negative.     Blood pressure 118/70, pulse 75, resp. rate 14, height 5\' 4"  (1.626 m), weight 158 lb (71.668 kg).  Physical Exam Physical Exam  Constitutional: She is oriented to person, place, and time. She appears well-developed and well-nourished.  Eyes: Conjunctivae are normal. No scleral icterus.  Neck: Neck supple.  Cardiovascular: Normal rate, regular rhythm and normal heart sounds.   Pulmonary/Chest: Effort normal and breath sounds normal. Right breast exhibits no inverted nipple, no mass, no nipple discharge, no skin change and no tenderness. Left breast exhibits no inverted nipple, no mass, no nipple discharge, no skin change and no tenderness.  Abdominal: Soft. Bowel sounds are normal.  Lymphadenopathy:    She has no cervical adenopathy.    She has no axillary adenopathy.  Neurological: She is alert  and oriented to person, place, and time.  Skin: Skin is warm and dry.    Data Reviewed Mammogram appears to be stable  Assessment    Stable breast exam with hx of cystic mastopathy     Plan    Follow up one year with bilateral scr mammogram       Blaine Hari G 07/16/2012, 7:39 PM

## 2012-07-17 ENCOUNTER — Encounter: Payer: Self-pay | Admitting: General Surgery

## 2012-07-24 ENCOUNTER — Ambulatory Visit: Payer: Self-pay | Admitting: General Surgery

## 2012-08-31 DIAGNOSIS — I10 Essential (primary) hypertension: Secondary | ICD-10-CM | POA: Diagnosis not present

## 2012-08-31 DIAGNOSIS — E669 Obesity, unspecified: Secondary | ICD-10-CM | POA: Diagnosis not present

## 2012-08-31 DIAGNOSIS — I209 Angina pectoris, unspecified: Secondary | ICD-10-CM | POA: Diagnosis not present

## 2012-08-31 DIAGNOSIS — E119 Type 2 diabetes mellitus without complications: Secondary | ICD-10-CM | POA: Diagnosis not present

## 2012-08-31 DIAGNOSIS — E785 Hyperlipidemia, unspecified: Secondary | ICD-10-CM | POA: Diagnosis not present

## 2012-08-31 DIAGNOSIS — R0602 Shortness of breath: Secondary | ICD-10-CM | POA: Diagnosis not present

## 2012-09-14 DIAGNOSIS — E7800 Pure hypercholesterolemia, unspecified: Secondary | ICD-10-CM | POA: Diagnosis not present

## 2012-09-14 DIAGNOSIS — E109 Type 1 diabetes mellitus without complications: Secondary | ICD-10-CM | POA: Diagnosis not present

## 2012-09-14 DIAGNOSIS — G609 Hereditary and idiopathic neuropathy, unspecified: Secondary | ICD-10-CM | POA: Diagnosis not present

## 2012-09-14 DIAGNOSIS — R809 Proteinuria, unspecified: Secondary | ICD-10-CM | POA: Diagnosis not present

## 2012-09-14 DIAGNOSIS — E1039 Type 1 diabetes mellitus with other diabetic ophthalmic complication: Secondary | ICD-10-CM | POA: Diagnosis not present

## 2012-09-14 DIAGNOSIS — Z79899 Other long term (current) drug therapy: Secondary | ICD-10-CM | POA: Diagnosis not present

## 2012-09-14 DIAGNOSIS — E1169 Type 2 diabetes mellitus with other specified complication: Secondary | ICD-10-CM | POA: Diagnosis not present

## 2012-09-14 DIAGNOSIS — N183 Chronic kidney disease, stage 3 unspecified: Secondary | ICD-10-CM | POA: Diagnosis not present

## 2012-09-14 DIAGNOSIS — Z951 Presence of aortocoronary bypass graft: Secondary | ICD-10-CM | POA: Diagnosis not present

## 2012-09-14 DIAGNOSIS — E11359 Type 2 diabetes mellitus with proliferative diabetic retinopathy without macular edema: Secondary | ICD-10-CM | POA: Diagnosis not present

## 2012-09-14 DIAGNOSIS — I2510 Atherosclerotic heart disease of native coronary artery without angina pectoris: Secondary | ICD-10-CM | POA: Diagnosis not present

## 2012-09-14 DIAGNOSIS — E060 Acute thyroiditis: Secondary | ICD-10-CM | POA: Diagnosis not present

## 2012-09-14 DIAGNOSIS — G5600 Carpal tunnel syndrome, unspecified upper limb: Secondary | ICD-10-CM | POA: Diagnosis not present

## 2012-09-14 DIAGNOSIS — E1069 Type 1 diabetes mellitus with other specified complication: Secondary | ICD-10-CM | POA: Diagnosis not present

## 2012-10-04 DIAGNOSIS — E11359 Type 2 diabetes mellitus with proliferative diabetic retinopathy without macular edema: Secondary | ICD-10-CM | POA: Diagnosis not present

## 2012-10-04 DIAGNOSIS — I209 Angina pectoris, unspecified: Secondary | ICD-10-CM | POA: Diagnosis not present

## 2012-10-04 DIAGNOSIS — E1039 Type 1 diabetes mellitus with other diabetic ophthalmic complication: Secondary | ICD-10-CM | POA: Diagnosis not present

## 2012-10-04 DIAGNOSIS — H35049 Retinal micro-aneurysms, unspecified, unspecified eye: Secondary | ICD-10-CM | POA: Diagnosis not present

## 2012-11-11 DIAGNOSIS — L259 Unspecified contact dermatitis, unspecified cause: Secondary | ICD-10-CM | POA: Diagnosis not present

## 2012-12-07 DIAGNOSIS — E109 Type 1 diabetes mellitus without complications: Secondary | ICD-10-CM | POA: Diagnosis not present

## 2012-12-07 DIAGNOSIS — I359 Nonrheumatic aortic valve disorder, unspecified: Secondary | ICD-10-CM | POA: Diagnosis not present

## 2012-12-31 DIAGNOSIS — H26499 Other secondary cataract, unspecified eye: Secondary | ICD-10-CM | POA: Diagnosis not present

## 2012-12-31 DIAGNOSIS — Z961 Presence of intraocular lens: Secondary | ICD-10-CM | POA: Diagnosis not present

## 2012-12-31 DIAGNOSIS — E1139 Type 2 diabetes mellitus with other diabetic ophthalmic complication: Secondary | ICD-10-CM | POA: Diagnosis not present

## 2013-03-11 DIAGNOSIS — Z951 Presence of aortocoronary bypass graft: Secondary | ICD-10-CM | POA: Diagnosis not present

## 2013-03-11 DIAGNOSIS — E1165 Type 2 diabetes mellitus with hyperglycemia: Secondary | ICD-10-CM | POA: Diagnosis not present

## 2013-03-11 DIAGNOSIS — N182 Chronic kidney disease, stage 2 (mild): Secondary | ICD-10-CM | POA: Diagnosis not present

## 2013-03-11 DIAGNOSIS — I70219 Atherosclerosis of native arteries of extremities with intermittent claudication, unspecified extremity: Secondary | ICD-10-CM | POA: Diagnosis not present

## 2013-03-11 DIAGNOSIS — N951 Menopausal and female climacteric states: Secondary | ICD-10-CM | POA: Diagnosis not present

## 2013-03-11 DIAGNOSIS — E1039 Type 1 diabetes mellitus with other diabetic ophthalmic complication: Secondary | ICD-10-CM | POA: Diagnosis not present

## 2013-03-11 DIAGNOSIS — E1129 Type 2 diabetes mellitus with other diabetic kidney complication: Secondary | ICD-10-CM | POA: Diagnosis not present

## 2013-03-11 DIAGNOSIS — G609 Hereditary and idiopathic neuropathy, unspecified: Secondary | ICD-10-CM | POA: Diagnosis not present

## 2013-03-11 DIAGNOSIS — I739 Peripheral vascular disease, unspecified: Secondary | ICD-10-CM | POA: Diagnosis not present

## 2013-03-11 DIAGNOSIS — E109 Type 1 diabetes mellitus without complications: Secondary | ICD-10-CM | POA: Diagnosis not present

## 2013-03-11 DIAGNOSIS — N058 Unspecified nephritic syndrome with other morphologic changes: Secondary | ICD-10-CM | POA: Diagnosis not present

## 2013-03-11 DIAGNOSIS — E1139 Type 2 diabetes mellitus with other diabetic ophthalmic complication: Secondary | ICD-10-CM | POA: Diagnosis not present

## 2013-03-11 DIAGNOSIS — E11359 Type 2 diabetes mellitus with proliferative diabetic retinopathy without macular edema: Secondary | ICD-10-CM | POA: Diagnosis not present

## 2013-03-11 DIAGNOSIS — E1169 Type 2 diabetes mellitus with other specified complication: Secondary | ICD-10-CM | POA: Diagnosis not present

## 2013-03-11 DIAGNOSIS — I2510 Atherosclerotic heart disease of native coronary artery without angina pectoris: Secondary | ICD-10-CM | POA: Diagnosis not present

## 2013-06-14 DIAGNOSIS — E785 Hyperlipidemia, unspecified: Secondary | ICD-10-CM | POA: Diagnosis not present

## 2013-06-14 DIAGNOSIS — M19049 Primary osteoarthritis, unspecified hand: Secondary | ICD-10-CM | POA: Diagnosis not present

## 2013-06-14 DIAGNOSIS — I060 Rheumatic aortic stenosis: Secondary | ICD-10-CM | POA: Diagnosis not present

## 2013-06-14 DIAGNOSIS — I209 Angina pectoris, unspecified: Secondary | ICD-10-CM | POA: Diagnosis not present

## 2013-06-14 DIAGNOSIS — I10 Essential (primary) hypertension: Secondary | ICD-10-CM | POA: Diagnosis not present

## 2013-07-15 DIAGNOSIS — E7800 Pure hypercholesterolemia, unspecified: Secondary | ICD-10-CM | POA: Diagnosis not present

## 2013-07-15 DIAGNOSIS — E109 Type 1 diabetes mellitus without complications: Secondary | ICD-10-CM | POA: Diagnosis not present

## 2013-07-15 DIAGNOSIS — N182 Chronic kidney disease, stage 2 (mild): Secondary | ICD-10-CM | POA: Diagnosis not present

## 2013-07-15 DIAGNOSIS — E11359 Type 2 diabetes mellitus with proliferative diabetic retinopathy without macular edema: Secondary | ICD-10-CM | POA: Diagnosis not present

## 2013-07-15 DIAGNOSIS — I2510 Atherosclerotic heart disease of native coronary artery without angina pectoris: Secondary | ICD-10-CM | POA: Diagnosis not present

## 2013-07-15 DIAGNOSIS — E1039 Type 1 diabetes mellitus with other diabetic ophthalmic complication: Secondary | ICD-10-CM | POA: Diagnosis not present

## 2013-07-15 DIAGNOSIS — G609 Hereditary and idiopathic neuropathy, unspecified: Secondary | ICD-10-CM | POA: Diagnosis not present

## 2013-07-15 DIAGNOSIS — E663 Overweight: Secondary | ICD-10-CM | POA: Diagnosis not present

## 2013-07-15 DIAGNOSIS — E1169 Type 2 diabetes mellitus with other specified complication: Secondary | ICD-10-CM | POA: Diagnosis not present

## 2013-07-15 DIAGNOSIS — E1139 Type 2 diabetes mellitus with other diabetic ophthalmic complication: Secondary | ICD-10-CM | POA: Diagnosis not present

## 2013-07-15 DIAGNOSIS — H35049 Retinal micro-aneurysms, unspecified, unspecified eye: Secondary | ICD-10-CM | POA: Diagnosis not present

## 2013-07-18 ENCOUNTER — Ambulatory Visit: Payer: Medicare Other | Admitting: General Surgery

## 2013-07-30 ENCOUNTER — Ambulatory Visit: Payer: Medicare Other | Admitting: General Surgery

## 2013-08-01 ENCOUNTER — Ambulatory Visit: Payer: Medicare Other | Admitting: General Surgery

## 2013-08-12 ENCOUNTER — Ambulatory Visit (INDEPENDENT_AMBULATORY_CARE_PROVIDER_SITE_OTHER): Payer: Medicare Other | Admitting: General Surgery

## 2013-08-12 ENCOUNTER — Other Ambulatory Visit: Payer: Medicare Other

## 2013-08-12 ENCOUNTER — Encounter: Payer: Self-pay | Admitting: General Surgery

## 2013-08-12 VITALS — BP 128/72 | HR 74 | Resp 12 | Ht 64.0 in

## 2013-08-12 DIAGNOSIS — R234 Changes in skin texture: Secondary | ICD-10-CM | POA: Diagnosis not present

## 2013-08-12 DIAGNOSIS — N63 Unspecified lump in unspecified breast: Secondary | ICD-10-CM

## 2013-08-12 DIAGNOSIS — Z1231 Encounter for screening mammogram for malignant neoplasm of breast: Secondary | ICD-10-CM | POA: Diagnosis not present

## 2013-08-12 NOTE — Patient Instructions (Addendum)
Patient to return in one month. Please call back to arrange an appointment.  Continue self breast exams. Call office for any new breast issues or concerns.

## 2013-08-12 NOTE — Progress Notes (Signed)
Patient ID: Gail IslamLynne H Hinesley, female   DOB: 09/01/1955, 58 y.o.   MRN: [id number]  Chief Complaint  Patient presents with  . Follow-up    mammogram    HPI Gail Franco is a 58 y.o. female who presents for a breast evaluation. The most recent mamm[facility] was [facility] on  08/12/13 [facility].   Patient does perform regular self breast checks and gets regular mammograms done.    HPI  Past Medical History  Diagnosis Date  . Diffuse cystic mastopathy   . Arthritis   . Thyroid disease 2008    hyperactive  . Diabetes mellitus without complication   . Myocardial infarction     Past Surgical History  Procedure Laterality Date  . Colonoscopy  2010  . Other surgical history  2011    scar revision d/t fistula lumbar fusion  . Upper gi endoscopy  2010, 2011  . Cardiac catheterization  2010  . Lumbar fusions  2010  . Abdominal hysterectomy  1992  . Appendectomy    . Shoulder surgery    . Coronary artery bypass graft  1995  . Carpal tunnel release    . Breast biopsy  1992    History reviewed. No pertinent family history.  Social History History  Substance Use Topics  . Smoking status: Never Smoker   . Smokeless tobacco: Never Used  . Alcohol Use: No    Allergies  Allergen Reactions  . Other Itching    Steri strips     Current Outpatient Prescriptions  Medication Sig Dispense Refill  . acetaminophen (TYLENOL) 500 MG tablet Take 500 mg by mouth every 6 (six) hours as needed for pain.      Marland Kitchen. atorvastatin (LIPITOR) 20 MG tablet       . carvedilol (COREG) 12.5 MG tablet Take 1 tablet by mouth daily.      . clopidogrel (PLAVIX) 75 MG tablet Take 75 mg by mouth daily.      . cyclobenzaprine (FLEXERIL) 10 MG tablet Take 10 mg by mouth daily.      Marland Kitchen. FLUoxetine (PROZAC) 20 MG capsule Take by mouth daily.      Marland Kitchen. HUMALOG 100 UNIT/ML injection       . LANTUS 100 UNIT/ML injection       . lisinopril (PRINIVIL,ZESTRIL) 2.5 MG tablet Take 2.5 mg by mouth daily.      . pantoprazole (PROTONIX)  40 MG tablet Take 40 mg by mouth daily.      Marland Kitchen. topiramate (TOPAMAX) 50 MG tablet Take 50 mg by mouth 2 (two) times daily.       No current facility-administered medications for this visit.    Review of Systems Review of Systems  Constitutional: Negative.   Respiratory: Negative.   Cardiovascular: Negative.     Blood pressure 128/72, pulse 74, resp. rate 12, height 5\' 4"  (1.626 m).  Physical Exam Physical Exam  Constitutional: She is oriented to person, place, and time. She appears well-developed and well-nourished.  Eyes: Conjunctivae are normal. No scleral icterus.  Cardiovascular: Normal rate and regular rhythm.   Murmur heard.  Systolic ( this is an old murmur.) murmur is present with a grade of 4/6  Pulmonary/Chest: Effort normal and breath sounds normal. Right breast exhibits no inverted nipple, no mass, no nipple discharge, no skin change and no tenderness. Left breast exhibits mass. Left breast exhibits no inverted nipple, no nipple discharge, no skin change and no tenderness.  Left breast thickening at 12 o'clock-2cm ridge of  firm tissue 12-1 ocl just above areola.  Abdominal: Soft. Bowel sounds are normal. There is no tenderness.  Lymphadenopathy:    She has no cervical adenopathy.    She has no axillary adenopathy.  Neurological: She is alert and oriented to person, place, and time.  Skin: Skin is warm and dry.    Data Reviewed Mammogram reviewed -no findings.  Assessment    Performed left breast ultrasound- there is an elongated hypoechoic area at site of palpable finding. This may be fibrosis but warrants a short term recheck and biopsy if it persists. Discussed fully with pt..  Plan    Patient to return in one month.     The patient wishes to call to arrange one month follow up visit. She lives out of town and would like to make appointment when she will be coming back to the area.    SANKAR,SEEPLAPUTHUR G 08/13/2013, 5:36 AM

## 2013-08-13 ENCOUNTER — Encounter: Payer: Self-pay | Admitting: General Surgery

## 2013-08-14 ENCOUNTER — Encounter: Payer: Self-pay | Admitting: General Surgery

## 2013-09-11 ENCOUNTER — Encounter: Payer: Self-pay | Admitting: General Surgery

## 2013-09-11 ENCOUNTER — Ambulatory Visit (INDEPENDENT_AMBULATORY_CARE_PROVIDER_SITE_OTHER): Payer: Self-pay | Admitting: General Surgery

## 2013-09-11 VITALS — BP 128/72 | HR 72 | Resp 12 | Ht 64.0 in | Wt 158.0 lb

## 2013-09-11 DIAGNOSIS — N183 Chronic kidney disease, stage 3 unspecified: Secondary | ICD-10-CM | POA: Diagnosis not present

## 2013-09-11 DIAGNOSIS — E119 Type 2 diabetes mellitus without complications: Secondary | ICD-10-CM | POA: Diagnosis not present

## 2013-09-11 DIAGNOSIS — R234 Changes in skin texture: Secondary | ICD-10-CM

## 2013-09-11 DIAGNOSIS — E785 Hyperlipidemia, unspecified: Secondary | ICD-10-CM | POA: Diagnosis not present

## 2013-09-11 DIAGNOSIS — I5033 Acute on chronic diastolic (congestive) heart failure: Secondary | ICD-10-CM | POA: Diagnosis not present

## 2013-09-11 DIAGNOSIS — I10 Essential (primary) hypertension: Secondary | ICD-10-CM | POA: Diagnosis not present

## 2013-09-11 DIAGNOSIS — E559 Vitamin D deficiency, unspecified: Secondary | ICD-10-CM | POA: Diagnosis not present

## 2013-09-11 DIAGNOSIS — I060 Rheumatic aortic stenosis: Secondary | ICD-10-CM | POA: Diagnosis not present

## 2013-09-11 NOTE — Progress Notes (Signed)
Patient ID: Gail Franco, female   DOB: 01/15/1956, 58 y.o.   MRN: [id number]  Chief Complaint  Patient presents with  . Follow-up    breast     HPI Gail Franco is a 58 y.o. female here today for her one month breast follow up. On her yearly exam last month a small thickening noticed in the left breast. . Patient states she is doing well. No new breast problems.  HPI  Past Medical History  Diagnosis Date  . Diffuse cystic mastopathy   . Arthritis   . Thyroid disease 2008    hyperactive  . Diabetes mellitus without complication   . Myocardial infarction     Past Surgical History  Procedure Laterality Date  . Colonoscopy  2010  . Other surgical history  2011    scar revision d/t fistula lumbar fusion  . Upper gi endoscopy  2010, 2011  . Cardiac catheterization  2010  . Lumbar fusions  2010  . Abdominal hysterectomy  1992  . Appendectomy    . Shoulder surgery    . Coronary artery bypass graft  1995  . Carpal tunnel release    . Breast biopsy  1992    History reviewed. No pertinent family history.  Social History History  Substance Use Topics  . Smoking status: Never Smoker   . Smokeless tobacco: Never Used  . Alcohol Use: No    Allergies  Allergen Reactions  . Other Itching    Steri strips     Current Outpatient Prescriptions  Medication Sig Dispense Refill  . acetaminophen (TYLENOL) 500 MG tablet Take 500 mg by mouth every 6 (six) hours as needed for pain.      . carvedilol (COREG) 12.5 MG tablet Take 1 tablet by mouth daily.      . clopidogrel (PLAVIX) 75 MG tablet Take 75 mg by mouth daily.      . cyclobenzaprine (FLEXERIL) 10 MG tablet Take 10 mg by mouth daily.      Marland Kitchen. FLUoxetine (PROZAC) 20 MG capsule Take by mouth daily.      Marland Kitchen. HUMALOG 100 UNIT/ML injection       . LANTUS 100 UNIT/ML injection       . lisinopril (PRINIVIL,ZESTRIL) 2.5 MG tablet Take 2.5 mg by mouth daily.      . pantoprazole (PROTONIX) 40 MG tablet Take 40 mg by mouth daily.       Marland Kitchen. topiramate (TOPAMAX) 50 MG tablet Take 50 mg by mouth 2 (two) times daily.       No current facility-administered medications for this visit.    Review of Systems Review of Systems  Constitutional: Negative.   Respiratory: Negative.   Cardiovascular: Negative.     Blood pressure 128/72, pulse 72, resp. rate 12, height 5\' 4"  (1.626 m), weight 158 lb (71.668 kg).  Physical Exam Physical Exam Left breast exam- complete resolution of the thick ing at the 12- 1 o'clock location. No new findings in the breast. No axillary or cervical nodes. Data Reviewed Prior notes  Assessment    Left breast mass resolved.     Plan    Return to yearly checks next follow up in July 2016 and bilateral screening mammograms.        SANKAR,SEEPLAPUTHUR G 09/12/2013, 8:18 AM

## 2013-09-11 NOTE — Patient Instructions (Signed)
Return to yearly checks next follow up in July 2016 and bilateral screening mammograms.

## 2013-09-12 ENCOUNTER — Encounter: Payer: Self-pay | Admitting: General Surgery

## 2013-11-25 ENCOUNTER — Encounter: Payer: Self-pay | Admitting: General Surgery

## 2013-12-06 DIAGNOSIS — H6122 Impacted cerumen, left ear: Secondary | ICD-10-CM | POA: Diagnosis not present

## 2013-12-06 DIAGNOSIS — J329 Chronic sinusitis, unspecified: Secondary | ICD-10-CM | POA: Diagnosis not present

## 2013-12-13 DIAGNOSIS — N182 Chronic kidney disease, stage 2 (mild): Secondary | ICD-10-CM | POA: Diagnosis not present

## 2013-12-13 DIAGNOSIS — E10649 Type 1 diabetes mellitus with hypoglycemia without coma: Secondary | ICD-10-CM | POA: Diagnosis not present

## 2013-12-13 DIAGNOSIS — E11649 Type 2 diabetes mellitus with hypoglycemia without coma: Secondary | ICD-10-CM | POA: Diagnosis not present

## 2013-12-13 DIAGNOSIS — E10359 Type 1 diabetes mellitus with proliferative diabetic retinopathy without macular edema: Secondary | ICD-10-CM | POA: Diagnosis not present

## 2013-12-13 DIAGNOSIS — G629 Polyneuropathy, unspecified: Secondary | ICD-10-CM | POA: Diagnosis not present

## 2013-12-13 DIAGNOSIS — Z794 Long term (current) use of insulin: Secondary | ICD-10-CM | POA: Diagnosis not present

## 2013-12-13 DIAGNOSIS — I25810 Atherosclerosis of coronary artery bypass graft(s) without angina pectoris: Secondary | ICD-10-CM | POA: Diagnosis not present

## 2013-12-13 DIAGNOSIS — Z7982 Long term (current) use of aspirin: Secondary | ICD-10-CM | POA: Diagnosis not present

## 2013-12-13 DIAGNOSIS — Z23 Encounter for immunization: Secondary | ICD-10-CM | POA: Diagnosis not present

## 2013-12-13 DIAGNOSIS — E780 Pure hypercholesterolemia: Secondary | ICD-10-CM | POA: Diagnosis not present

## 2013-12-25 DIAGNOSIS — E784 Other hyperlipidemia: Secondary | ICD-10-CM | POA: Diagnosis not present

## 2013-12-25 DIAGNOSIS — E119 Type 2 diabetes mellitus without complications: Secondary | ICD-10-CM | POA: Diagnosis not present

## 2013-12-25 DIAGNOSIS — N183 Chronic kidney disease, stage 3 (moderate): Secondary | ICD-10-CM | POA: Diagnosis not present

## 2013-12-25 DIAGNOSIS — I209 Angina pectoris, unspecified: Secondary | ICD-10-CM | POA: Diagnosis not present

## 2014-01-08 DIAGNOSIS — Z961 Presence of intraocular lens: Secondary | ICD-10-CM | POA: Diagnosis not present

## 2014-01-08 DIAGNOSIS — E11359 Type 2 diabetes mellitus with proliferative diabetic retinopathy without macular edema: Secondary | ICD-10-CM | POA: Diagnosis not present

## 2014-01-08 DIAGNOSIS — E860 Dehydration: Secondary | ICD-10-CM | POA: Diagnosis not present

## 2014-01-08 DIAGNOSIS — R197 Diarrhea, unspecified: Secondary | ICD-10-CM | POA: Diagnosis not present

## 2014-01-08 DIAGNOSIS — H2512 Age-related nuclear cataract, left eye: Secondary | ICD-10-CM | POA: Diagnosis not present

## 2014-01-08 DIAGNOSIS — Z9889 Other specified postprocedural states: Secondary | ICD-10-CM | POA: Diagnosis not present

## 2014-01-08 DIAGNOSIS — Z794 Long term (current) use of insulin: Secondary | ICD-10-CM | POA: Diagnosis not present

## 2014-01-08 DIAGNOSIS — H26491 Other secondary cataract, right eye: Secondary | ICD-10-CM | POA: Diagnosis not present

## 2014-01-08 DIAGNOSIS — H4311 Vitreous hemorrhage, right eye: Secondary | ICD-10-CM | POA: Diagnosis not present

## 2014-01-08 DIAGNOSIS — R109 Unspecified abdominal pain: Secondary | ICD-10-CM | POA: Diagnosis not present

## 2014-02-06 DIAGNOSIS — H26491 Other secondary cataract, right eye: Secondary | ICD-10-CM | POA: Diagnosis not present

## 2014-02-06 DIAGNOSIS — H2512 Age-related nuclear cataract, left eye: Secondary | ICD-10-CM | POA: Diagnosis not present

## 2014-02-06 DIAGNOSIS — E11359 Type 2 diabetes mellitus with proliferative diabetic retinopathy without macular edema: Secondary | ICD-10-CM | POA: Diagnosis not present

## 2014-02-06 DIAGNOSIS — Z961 Presence of intraocular lens: Secondary | ICD-10-CM | POA: Diagnosis not present

## 2014-02-06 DIAGNOSIS — Z9889 Other specified postprocedural states: Secondary | ICD-10-CM | POA: Diagnosis not present

## 2014-02-06 DIAGNOSIS — Z794 Long term (current) use of insulin: Secondary | ICD-10-CM | POA: Diagnosis not present

## 2014-02-06 DIAGNOSIS — H4311 Vitreous hemorrhage, right eye: Secondary | ICD-10-CM | POA: Diagnosis not present

## 2014-04-14 DIAGNOSIS — H4311 Vitreous hemorrhage, right eye: Secondary | ICD-10-CM | POA: Diagnosis not present

## 2014-04-14 DIAGNOSIS — H26491 Other secondary cataract, right eye: Secondary | ICD-10-CM | POA: Diagnosis not present

## 2014-04-14 DIAGNOSIS — E11359 Type 2 diabetes mellitus with proliferative diabetic retinopathy without macular edema: Secondary | ICD-10-CM | POA: Diagnosis not present

## 2014-04-14 DIAGNOSIS — E119 Type 2 diabetes mellitus without complications: Secondary | ICD-10-CM | POA: Diagnosis not present

## 2014-04-15 DIAGNOSIS — R0602 Shortness of breath: Secondary | ICD-10-CM | POA: Diagnosis not present

## 2014-04-15 DIAGNOSIS — E119 Type 2 diabetes mellitus without complications: Secondary | ICD-10-CM | POA: Diagnosis not present

## 2014-04-15 DIAGNOSIS — I10 Essential (primary) hypertension: Secondary | ICD-10-CM | POA: Diagnosis not present

## 2014-04-15 DIAGNOSIS — E668 Other obesity: Secondary | ICD-10-CM | POA: Diagnosis not present

## 2014-07-11 DIAGNOSIS — E10359 Type 1 diabetes mellitus with proliferative diabetic retinopathy without macular edema: Secondary | ICD-10-CM | POA: Diagnosis not present

## 2014-07-11 DIAGNOSIS — I209 Angina pectoris, unspecified: Secondary | ICD-10-CM | POA: Diagnosis not present

## 2014-07-11 DIAGNOSIS — N182 Chronic kidney disease, stage 2 (mild): Secondary | ICD-10-CM | POA: Diagnosis not present

## 2014-07-11 DIAGNOSIS — E119 Type 2 diabetes mellitus without complications: Secondary | ICD-10-CM | POA: Diagnosis not present

## 2014-07-11 DIAGNOSIS — N183 Chronic kidney disease, stage 3 (moderate): Secondary | ICD-10-CM | POA: Diagnosis not present

## 2014-07-11 DIAGNOSIS — E780 Pure hypercholesterolemia: Secondary | ICD-10-CM | POA: Diagnosis not present

## 2014-07-11 DIAGNOSIS — E11649 Type 2 diabetes mellitus with hypoglycemia without coma: Secondary | ICD-10-CM | POA: Diagnosis not present

## 2014-07-11 DIAGNOSIS — I10 Essential (primary) hypertension: Secondary | ICD-10-CM | POA: Diagnosis not present

## 2014-07-11 DIAGNOSIS — E10649 Type 1 diabetes mellitus with hypoglycemia without coma: Secondary | ICD-10-CM | POA: Diagnosis not present

## 2014-07-11 DIAGNOSIS — I2510 Atherosclerotic heart disease of native coronary artery without angina pectoris: Secondary | ICD-10-CM | POA: Diagnosis not present

## 2014-07-11 DIAGNOSIS — Z79899 Other long term (current) drug therapy: Secondary | ICD-10-CM | POA: Diagnosis not present

## 2014-07-11 DIAGNOSIS — I25810 Atherosclerosis of coronary artery bypass graft(s) without angina pectoris: Secondary | ICD-10-CM | POA: Diagnosis not present

## 2014-08-21 ENCOUNTER — Encounter: Payer: Self-pay | Admitting: General Surgery

## 2014-08-21 ENCOUNTER — Ambulatory Visit (INDEPENDENT_AMBULATORY_CARE_PROVIDER_SITE_OTHER): Payer: Medicare Other | Admitting: General Surgery

## 2014-08-21 VITALS — BP 124/76 | HR 76 | Resp 12 | Ht 64.0 in | Wt 153.0 lb

## 2014-08-21 DIAGNOSIS — Z1231 Encounter for screening mammogram for malignant neoplasm of breast: Secondary | ICD-10-CM | POA: Diagnosis not present

## 2014-08-21 DIAGNOSIS — J029 Acute pharyngitis, unspecified: Secondary | ICD-10-CM | POA: Diagnosis not present

## 2014-08-21 DIAGNOSIS — J208 Acute bronchitis due to other specified organisms: Secondary | ICD-10-CM | POA: Diagnosis not present

## 2014-08-21 DIAGNOSIS — E119 Type 2 diabetes mellitus without complications: Secondary | ICD-10-CM | POA: Diagnosis not present

## 2014-08-21 DIAGNOSIS — N6019 Diffuse cystic mastopathy of unspecified breast: Secondary | ICD-10-CM | POA: Diagnosis not present

## 2014-08-21 DIAGNOSIS — N183 Chronic kidney disease, stage 3 (moderate): Secondary | ICD-10-CM | POA: Diagnosis not present

## 2014-08-21 NOTE — Progress Notes (Signed)
Patient ID: Gail Franco, female   DOB: [birth year], 59 y.o.   MRN: [id number]  Chief Complaint  Patient presents with  . Follow-up    mammogram    HPI Gail Franco is a 58 y.o. female who presents for a breast evaluation. The most recent mammogram was done on 08/21/14.  Patient does perform regular self breast checks and gets regular mammograms done.    HPI  Past Medical History  Diagnosis Date  . Diffuse cystic mastopathy   . Arthritis   . Thyroid disease 2008    hyperactive  . Diabetes mellitus without complication   . Myocardial infarction     Past Surgical History  Procedure Laterality Date  . Colonoscopy  2010  . Other surgical history  2011    scar revision d/t fistula lumbar fusion  . Upper gi endoscopy  2010, 2011  . Cardiac catheterization  2010  . Lumbar fusions  2010  . Abdominal hysterectomy  1992  . Appendectomy    . Shoulder surgery    . Coronary artery bypass graft  1995  . Carpal tunnel release    . Breast biopsy  1992    No family history on file.  Social History History  Substance Use Topics  . Smoking status: Never Smoker   . Smokeless tobacco: Never Used  . Alcohol Use: No    Allergies  Allergen Reactions  . Other Itching    Steri strips     Current Outpatient Prescriptions  Medication Sig Dispense Refill  . acetaminophen (TYLENOL) 500 MG tablet Take 500 mg by mouth every 6 (six) hours as needed for pain.    Marland Kitchen atorvastatin (LIPITOR) 40 MG tablet   3  . carvedilol (COREG) 12.5 MG tablet Take 1 tablet by mouth 2 (two) times daily with a meal. 6.25mg     . clopidogrel (PLAVIX) 75 MG tablet Take 75 mg by mouth daily.    . cyclobenzaprine (FLEXERIL) 10 MG tablet Take 10 mg by mouth daily.    Marland Kitchen FLUoxetine (PROZAC) 20 MG capsule Take by mouth 2 (two) times daily.     Marland Kitchen HUMALOG 100 UNIT/ML injection     . LANTUS 100 UNIT/ML injection     . lisinopril (PRINIVIL,ZESTRIL) 2.5 MG tablet Take 2.5 mg by mouth daily.    . pantoprazole  (PROTONIX) 40 MG tablet Take 40 mg by mouth daily.    Marland Kitchen topiramate (TOPAMAX) 50 MG tablet Take 50 mg by mouth 2 (two) times daily.     No current facility-administered medications for this visit.    Review of Systems Review of Systems  Constitutional: Negative.   Respiratory: Negative.   Cardiovascular: Negative.     Blood pressure 124/76, pulse 76, resp. rate 12, height 5\' 4"  (1.626 m), weight 153 lb (69.4 kg).  Physical Exam Physical Exam  Constitutional: She is oriented to person, place, and time. She appears well-nourished.  Eyes: Conjunctivae are normal. No scleral icterus.  Neck: Neck supple.  Cardiovascular: Normal rate, regular rhythm and normal heart sounds.   Pulmonary/Chest: Effort normal and breath sounds normal. Right breast exhibits no inverted nipple, no mass, no nipple discharge, no skin change and no tenderness. Left breast exhibits no inverted nipple, no mass, no nipple discharge, no skin change and no tenderness.  Abdominal: Soft. Bowel sounds are normal. There is no hepatomegaly. There is no tenderness. No hernia.  Lymphadenopathy:    She has no cervical adenopathy.    She has no axillary adenopathy.  Neurological: She is alert and oriented to person, place, and time.  Skin: Skin is warm and dry.    Data Reviewed Mammogram reviewed-appears benign. Report pending  Assessment     Fibrocystic disease of breast. Stable exam    Plan       Patient will be asked to return to the office in one year with a bilateral screening mammogram same day as mammogram in office.   PCP:  Okey Dupre, Jessica 08/21/2014, 10:47 AM

## 2014-08-21 NOTE — Patient Instructions (Addendum)
Patient will be asked to return to the office in one year with a bilateral screening mammogram.  Continue self breast exams. Call office for any new breast issues or concerns.  

## 2014-08-22 ENCOUNTER — Encounter: Payer: Self-pay | Admitting: General Surgery

## 2014-10-10 DIAGNOSIS — I060 Rheumatic aortic stenosis: Secondary | ICD-10-CM | POA: Diagnosis not present

## 2014-10-10 DIAGNOSIS — I5033 Acute on chronic diastolic (congestive) heart failure: Secondary | ICD-10-CM | POA: Diagnosis not present

## 2014-10-10 DIAGNOSIS — N183 Chronic kidney disease, stage 3 (moderate): Secondary | ICD-10-CM | POA: Diagnosis not present

## 2014-10-10 DIAGNOSIS — E119 Type 2 diabetes mellitus without complications: Secondary | ICD-10-CM | POA: Diagnosis not present

## 2014-10-13 DIAGNOSIS — Z23 Encounter for immunization: Secondary | ICD-10-CM | POA: Diagnosis not present

## 2015-01-12 DIAGNOSIS — H4311 Vitreous hemorrhage, right eye: Secondary | ICD-10-CM | POA: Diagnosis not present

## 2015-01-12 DIAGNOSIS — H43811 Vitreous degeneration, right eye: Secondary | ICD-10-CM | POA: Diagnosis not present

## 2015-01-12 DIAGNOSIS — H2512 Age-related nuclear cataract, left eye: Secondary | ICD-10-CM | POA: Diagnosis not present

## 2015-01-12 DIAGNOSIS — H35043 Retinal micro-aneurysms, unspecified, bilateral: Secondary | ICD-10-CM | POA: Diagnosis not present

## 2015-01-14 DIAGNOSIS — E119 Type 2 diabetes mellitus without complications: Secondary | ICD-10-CM | POA: Diagnosis not present

## 2015-01-14 DIAGNOSIS — E1140 Type 2 diabetes mellitus with diabetic neuropathy, unspecified: Secondary | ICD-10-CM | POA: Diagnosis not present

## 2015-01-14 DIAGNOSIS — E103599 Type 1 diabetes mellitus with proliferative diabetic retinopathy without macular edema, unspecified eye: Secondary | ICD-10-CM | POA: Diagnosis not present

## 2015-01-14 DIAGNOSIS — I25810 Atherosclerosis of coronary artery bypass graft(s) without angina pectoris: Secondary | ICD-10-CM | POA: Diagnosis not present

## 2015-01-14 DIAGNOSIS — E663 Overweight: Secondary | ICD-10-CM | POA: Diagnosis not present

## 2015-01-14 DIAGNOSIS — E11649 Type 2 diabetes mellitus with hypoglycemia without coma: Secondary | ICD-10-CM | POA: Diagnosis not present

## 2015-01-14 DIAGNOSIS — Z6827 Body mass index (BMI) 27.0-27.9, adult: Secondary | ICD-10-CM | POA: Diagnosis not present

## 2015-01-14 DIAGNOSIS — Z951 Presence of aortocoronary bypass graft: Secondary | ICD-10-CM | POA: Diagnosis not present

## 2015-01-14 DIAGNOSIS — Z23 Encounter for immunization: Secondary | ICD-10-CM | POA: Diagnosis not present

## 2015-01-14 DIAGNOSIS — E1022 Type 1 diabetes mellitus with diabetic chronic kidney disease: Secondary | ICD-10-CM | POA: Diagnosis not present

## 2015-01-14 DIAGNOSIS — Z7982 Long term (current) use of aspirin: Secondary | ICD-10-CM | POA: Diagnosis not present

## 2015-01-14 DIAGNOSIS — R809 Proteinuria, unspecified: Secondary | ICD-10-CM | POA: Diagnosis not present

## 2015-01-14 DIAGNOSIS — I060 Rheumatic aortic stenosis: Secondary | ICD-10-CM | POA: Diagnosis not present

## 2015-01-14 DIAGNOSIS — E10649 Type 1 diabetes mellitus with hypoglycemia without coma: Secondary | ICD-10-CM | POA: Diagnosis not present

## 2015-01-14 DIAGNOSIS — N182 Chronic kidney disease, stage 2 (mild): Secondary | ICD-10-CM | POA: Diagnosis not present

## 2015-01-14 DIAGNOSIS — E784 Other hyperlipidemia: Secondary | ICD-10-CM | POA: Diagnosis not present

## 2015-01-14 DIAGNOSIS — E7800 Pure hypercholesterolemia, unspecified: Secondary | ICD-10-CM | POA: Diagnosis not present

## 2015-04-15 DIAGNOSIS — N183 Chronic kidney disease, stage 3 (moderate): Secondary | ICD-10-CM | POA: Diagnosis not present

## 2015-04-15 DIAGNOSIS — I10 Essential (primary) hypertension: Secondary | ICD-10-CM | POA: Diagnosis not present

## 2015-04-15 DIAGNOSIS — E119 Type 2 diabetes mellitus without complications: Secondary | ICD-10-CM | POA: Diagnosis not present

## 2015-04-15 DIAGNOSIS — E784 Other hyperlipidemia: Secondary | ICD-10-CM | POA: Diagnosis not present

## 2015-04-23 DIAGNOSIS — E784 Other hyperlipidemia: Secondary | ICD-10-CM | POA: Diagnosis not present

## 2015-04-23 DIAGNOSIS — I10 Essential (primary) hypertension: Secondary | ICD-10-CM | POA: Diagnosis not present

## 2015-04-23 DIAGNOSIS — E119 Type 2 diabetes mellitus without complications: Secondary | ICD-10-CM | POA: Diagnosis not present

## 2015-06-15 DIAGNOSIS — E11641 Type 2 diabetes mellitus with hypoglycemia with coma: Secondary | ICD-10-CM | POA: Diagnosis not present

## 2015-06-15 DIAGNOSIS — R402431 Glasgow coma scale score 3-8, in the field [EMT or ambulance]: Secondary | ICD-10-CM | POA: Diagnosis not present

## 2015-06-15 DIAGNOSIS — I2510 Atherosclerotic heart disease of native coronary artery without angina pectoris: Secondary | ICD-10-CM | POA: Diagnosis not present

## 2015-06-15 DIAGNOSIS — I10 Essential (primary) hypertension: Secondary | ICD-10-CM | POA: Diagnosis not present

## 2015-06-15 DIAGNOSIS — R110 Nausea: Secondary | ICD-10-CM | POA: Diagnosis not present

## 2015-06-15 DIAGNOSIS — E161 Other hypoglycemia: Secondary | ICD-10-CM | POA: Diagnosis not present

## 2015-06-15 DIAGNOSIS — Z794 Long term (current) use of insulin: Secondary | ICD-10-CM | POA: Diagnosis not present

## 2015-06-15 DIAGNOSIS — E785 Hyperlipidemia, unspecified: Secondary | ICD-10-CM | POA: Diagnosis not present

## 2015-07-15 DIAGNOSIS — E784 Other hyperlipidemia: Secondary | ICD-10-CM | POA: Diagnosis not present

## 2015-07-15 DIAGNOSIS — E119 Type 2 diabetes mellitus without complications: Secondary | ICD-10-CM | POA: Diagnosis not present

## 2015-07-15 DIAGNOSIS — I10 Essential (primary) hypertension: Secondary | ICD-10-CM | POA: Diagnosis not present

## 2015-07-15 DIAGNOSIS — N183 Chronic kidney disease, stage 3 (moderate): Secondary | ICD-10-CM | POA: Diagnosis not present

## 2015-07-29 DIAGNOSIS — E7800 Pure hypercholesterolemia, unspecified: Secondary | ICD-10-CM | POA: Diagnosis not present

## 2015-07-29 DIAGNOSIS — Z8249 Family history of ischemic heart disease and other diseases of the circulatory system: Secondary | ICD-10-CM | POA: Diagnosis not present

## 2015-07-29 DIAGNOSIS — E1022 Type 1 diabetes mellitus with diabetic chronic kidney disease: Secondary | ICD-10-CM | POA: Diagnosis not present

## 2015-07-29 DIAGNOSIS — Z79899 Other long term (current) drug therapy: Secondary | ICD-10-CM | POA: Diagnosis not present

## 2015-07-29 DIAGNOSIS — E11649 Type 2 diabetes mellitus with hypoglycemia without coma: Secondary | ICD-10-CM | POA: Diagnosis not present

## 2015-07-29 DIAGNOSIS — Z833 Family history of diabetes mellitus: Secondary | ICD-10-CM | POA: Diagnosis not present

## 2015-07-29 DIAGNOSIS — E103599 Type 1 diabetes mellitus with proliferative diabetic retinopathy without macular edema, unspecified eye: Secondary | ICD-10-CM | POA: Diagnosis not present

## 2015-07-29 DIAGNOSIS — I2510 Atherosclerotic heart disease of native coronary artery without angina pectoris: Secondary | ICD-10-CM | POA: Diagnosis not present

## 2015-07-29 DIAGNOSIS — N182 Chronic kidney disease, stage 2 (mild): Secondary | ICD-10-CM | POA: Diagnosis not present

## 2015-07-29 DIAGNOSIS — E10649 Type 1 diabetes mellitus with hypoglycemia without coma: Secondary | ICD-10-CM | POA: Diagnosis not present

## 2015-07-29 DIAGNOSIS — Z7982 Long term (current) use of aspirin: Secondary | ICD-10-CM | POA: Diagnosis not present

## 2015-07-29 DIAGNOSIS — I25810 Atherosclerosis of coronary artery bypass graft(s) without angina pectoris: Secondary | ICD-10-CM | POA: Diagnosis not present

## 2015-08-18 ENCOUNTER — Encounter: Payer: Self-pay | Admitting: *Deleted

## 2015-08-25 ENCOUNTER — Ambulatory Visit (INDEPENDENT_AMBULATORY_CARE_PROVIDER_SITE_OTHER): Payer: Medicare Other | Admitting: General Surgery

## 2015-08-25 ENCOUNTER — Encounter: Payer: Self-pay | Admitting: General Surgery

## 2015-08-25 VITALS — BP 126/74 | HR 74 | Resp 12 | Ht 64.0 in | Wt 148.0 lb

## 2015-08-25 DIAGNOSIS — Z1231 Encounter for screening mammogram for malignant neoplasm of breast: Secondary | ICD-10-CM | POA: Diagnosis not present

## 2015-08-25 DIAGNOSIS — N6019 Diffuse cystic mastopathy of unspecified breast: Secondary | ICD-10-CM

## 2015-08-25 NOTE — Patient Instructions (Signed)
Patient will be asked to return to the office in one year with a bilateral screening mammogram same day as mammogram in office.

## 2015-08-25 NOTE — Progress Notes (Signed)
Patient ID: Gail Franco, female   DOB: [birth year], 60 y.o.   MRN: [id number]  Chief Complaint  Patient presents with  . Follow-up    mammogram  . Trauma Follow Up    HPI Gail Franco is a 60 y.o. female who presents for her yearly breast exam. The most recent mammogram was done on 08/25/15. Patient has no breast complaints. Patient does perform regular self breast checks and gets regular mammograms done.   I have reviewed the history of present illness with the patient.  HPI  Past Medical History:  Diagnosis Date  . Arthritis   . Diabetes mellitus without complication (HCC)   . Diffuse cystic mastopathy   . Myocardial infarction (HCC)   . Thyroid disease 2008   hyperactive    Past Surgical History:  Procedure Laterality Date  . ABDOMINAL HYSTERECTOMY  1992  . APPENDECTOMY    . BREAST BIOPSY  1992  . CARDIAC CATHETERIZATION  2010  . CARPAL TUNNEL RELEASE    . COLONOSCOPY  2010  . CORONARY ARTERY BYPASS GRAFT  1995  . lumbar fusions  2010  . OTHER SURGICAL HISTORY  2011   scar revision d/t fistula lumbar fusion  . SHOULDER SURGERY    . UPPER GI ENDOSCOPY  2010, 2011    No family history on file.  Social History Social History  Substance Use Topics  . Smoking status: Never Smoker  . Smokeless tobacco: Never Used  . Alcohol use No    Allergies  Allergen Reactions  . Other Itching    Steri strips     Current Outpatient Prescriptions  Medication Sig Dispense Refill  . acetaminophen (TYLENOL) 500 MG tablet Take 500 mg by mouth every 6 (six) hours as needed for pain.    Marland Kitchen atorvastatin (LIPITOR) 40 MG tablet   3  . carvedilol (COREG) 12.5 MG tablet Take 1 tablet by mouth 2 (two) times daily with a meal. 6.25mg     . clopidogrel (PLAVIX) 75 MG tablet Take 75 mg by mouth daily.    . cyclobenzaprine (FLEXERIL) 10 MG tablet Take 10 mg by mouth daily.    Marland Kitchen FLUoxetine (PROZAC) 20 MG capsule Take by mouth 2 (two) times daily.     Marland Kitchen HUMALOG 100 UNIT/ML injection      . LANTUS 100 UNIT/ML injection     . lisinopril (PRINIVIL,ZESTRIL) 2.5 MG tablet Take 2.5 mg by mouth daily.    . pantoprazole (PROTONIX) 40 MG tablet Take 40 mg by mouth daily.    Marland Kitchen topiramate (TOPAMAX) 50 MG tablet Take 50 mg by mouth 2 (two) times daily.     No current facility-administered medications for this visit.     Review of Systems Review of Systems  Constitutional: Negative.   Respiratory: Negative.   Cardiovascular: Negative.     Blood pressure 126/74, pulse 74, resp. rate 12, height 5\' 4"  (1.626 m), weight 148 lb (67.1 kg).  Physical Exam Physical Exam  Constitutional: She is oriented to person, place, and time. She appears well-developed and well-nourished.  Eyes: Conjunctivae are normal. No scleral icterus.  Neck: Neck supple.  Cardiovascular: Normal rate, regular rhythm and normal heart sounds.   Pulmonary/Chest: Effort normal and breath sounds normal. Right breast exhibits no inverted nipple, no mass, no nipple discharge, no skin change and no tenderness. Left breast exhibits no inverted nipple, no mass, no nipple discharge, no skin change and no tenderness.  Abdominal: Soft. Bowel sounds are normal. There is no tenderness.  Lymphadenopathy:    She has no cervical adenopathy.    She has no axillary adenopathy.  Neurological: She is alert and oriented to person, place, and time.  Skin: Skin is warm and dry.  Psychiatric: She has a normal mood and affect.    Data Reviewed Prior notes, 2016 Mammogram reviewed; 2017 mammogram data not available at time of visit.  Assessment       Fibrocystic disease of breast. Stable exam  Plan    Will review mammogram report when available and patient will be advised.    Patient will be asked to return to the office in one year with a bilateral screening mammogram same day as mammogram in office.   This information has been scribed by Ples Specter CMA.   Gail Franco 08/25/2015, 11:43 AM

## 2015-09-23 DIAGNOSIS — Z79899 Other long term (current) drug therapy: Secondary | ICD-10-CM | POA: Diagnosis not present

## 2015-09-23 DIAGNOSIS — E785 Hyperlipidemia, unspecified: Secondary | ICD-10-CM | POA: Diagnosis not present

## 2015-09-23 DIAGNOSIS — X500XXA Overexertion from strenuous movement or load, initial encounter: Secondary | ICD-10-CM | POA: Diagnosis not present

## 2015-09-23 DIAGNOSIS — Y9389 Activity, other specified: Secondary | ICD-10-CM | POA: Diagnosis not present

## 2015-09-23 DIAGNOSIS — I10 Essential (primary) hypertension: Secondary | ICD-10-CM | POA: Diagnosis not present

## 2015-09-23 DIAGNOSIS — Z9889 Other specified postprocedural states: Secondary | ICD-10-CM | POA: Diagnosis not present

## 2015-09-23 DIAGNOSIS — Y929 Unspecified place or not applicable: Secondary | ICD-10-CM | POA: Diagnosis not present

## 2015-09-23 DIAGNOSIS — I2510 Atherosclerotic heart disease of native coronary artery without angina pectoris: Secondary | ICD-10-CM | POA: Diagnosis not present

## 2015-09-23 DIAGNOSIS — Z9089 Acquired absence of other organs: Secondary | ICD-10-CM | POA: Diagnosis not present

## 2015-09-23 DIAGNOSIS — Z794 Long term (current) use of insulin: Secondary | ICD-10-CM | POA: Diagnosis not present

## 2015-09-23 DIAGNOSIS — M25551 Pain in right hip: Secondary | ICD-10-CM | POA: Diagnosis not present

## 2015-09-23 DIAGNOSIS — E119 Type 2 diabetes mellitus without complications: Secondary | ICD-10-CM | POA: Diagnosis not present

## 2015-09-23 DIAGNOSIS — Z90710 Acquired absence of both cervix and uterus: Secondary | ICD-10-CM | POA: Diagnosis not present

## 2015-09-23 DIAGNOSIS — S79911A Unspecified injury of right hip, initial encounter: Secondary | ICD-10-CM | POA: Diagnosis not present

## 2015-09-29 DIAGNOSIS — R601 Generalized edema: Secondary | ICD-10-CM | POA: Diagnosis not present

## 2015-09-29 DIAGNOSIS — S73191A Other sprain of right hip, initial encounter: Secondary | ICD-10-CM | POA: Diagnosis not present

## 2015-09-29 DIAGNOSIS — S76311A Strain of muscle, fascia and tendon of the posterior muscle group at thigh level, right thigh, initial encounter: Secondary | ICD-10-CM | POA: Diagnosis not present

## 2015-09-29 DIAGNOSIS — S76011A Strain of muscle, fascia and tendon of right hip, initial encounter: Secondary | ICD-10-CM | POA: Diagnosis not present

## 2015-09-29 DIAGNOSIS — M25551 Pain in right hip: Secondary | ICD-10-CM | POA: Diagnosis not present

## 2015-10-01 DIAGNOSIS — M25551 Pain in right hip: Secondary | ICD-10-CM | POA: Diagnosis not present

## 2015-10-19 DIAGNOSIS — M6798 Unspecified disorder of synovium and tendon, other site: Secondary | ICD-10-CM | POA: Diagnosis not present

## 2015-10-19 DIAGNOSIS — Z23 Encounter for immunization: Secondary | ICD-10-CM | POA: Diagnosis not present

## 2015-10-19 DIAGNOSIS — H26491 Other secondary cataract, right eye: Secondary | ICD-10-CM | POA: Diagnosis not present

## 2015-10-19 DIAGNOSIS — I10 Essential (primary) hypertension: Secondary | ICD-10-CM | POA: Diagnosis not present

## 2015-10-19 DIAGNOSIS — E103553 Type 1 diabetes mellitus with stable proliferative diabetic retinopathy, bilateral: Secondary | ICD-10-CM | POA: Diagnosis not present

## 2015-10-19 DIAGNOSIS — H4311 Vitreous hemorrhage, right eye: Secondary | ICD-10-CM | POA: Diagnosis not present

## 2015-10-19 DIAGNOSIS — E119 Type 2 diabetes mellitus without complications: Secondary | ICD-10-CM | POA: Diagnosis not present

## 2015-10-19 DIAGNOSIS — H43811 Vitreous degeneration, right eye: Secondary | ICD-10-CM | POA: Diagnosis not present

## 2015-10-19 DIAGNOSIS — N183 Chronic kidney disease, stage 3 (moderate): Secondary | ICD-10-CM | POA: Diagnosis not present

## 2015-11-05 DIAGNOSIS — S76011D Strain of muscle, fascia and tendon of right hip, subsequent encounter: Secondary | ICD-10-CM | POA: Diagnosis not present

## 2015-11-17 DIAGNOSIS — H04123 Dry eye syndrome of bilateral lacrimal glands: Secondary | ICD-10-CM | POA: Diagnosis not present

## 2015-11-17 DIAGNOSIS — H2512 Age-related nuclear cataract, left eye: Secondary | ICD-10-CM | POA: Diagnosis not present

## 2015-11-17 DIAGNOSIS — E119 Type 2 diabetes mellitus without complications: Secondary | ICD-10-CM | POA: Diagnosis not present

## 2015-11-17 DIAGNOSIS — Z961 Presence of intraocular lens: Secondary | ICD-10-CM | POA: Diagnosis not present

## 2015-12-09 DIAGNOSIS — I10 Essential (primary) hypertension: Secondary | ICD-10-CM | POA: Diagnosis not present

## 2015-12-09 DIAGNOSIS — E1140 Type 2 diabetes mellitus with diabetic neuropathy, unspecified: Secondary | ICD-10-CM | POA: Diagnosis not present

## 2015-12-09 DIAGNOSIS — N183 Chronic kidney disease, stage 3 (moderate): Secondary | ICD-10-CM | POA: Diagnosis not present

## 2015-12-09 DIAGNOSIS — E784 Other hyperlipidemia: Secondary | ICD-10-CM | POA: Diagnosis not present

## 2015-12-28 DIAGNOSIS — H2512 Age-related nuclear cataract, left eye: Secondary | ICD-10-CM | POA: Diagnosis not present

## 2015-12-28 DIAGNOSIS — I2510 Atherosclerotic heart disease of native coronary artery without angina pectoris: Secondary | ICD-10-CM | POA: Diagnosis not present

## 2015-12-28 DIAGNOSIS — Z7902 Long term (current) use of antithrombotics/antiplatelets: Secondary | ICD-10-CM | POA: Diagnosis not present

## 2015-12-28 DIAGNOSIS — Z951 Presence of aortocoronary bypass graft: Secondary | ICD-10-CM | POA: Diagnosis not present

## 2015-12-28 DIAGNOSIS — I252 Old myocardial infarction: Secondary | ICD-10-CM | POA: Diagnosis not present

## 2015-12-28 DIAGNOSIS — E119 Type 2 diabetes mellitus without complications: Secondary | ICD-10-CM | POA: Diagnosis not present

## 2015-12-28 DIAGNOSIS — Z7982 Long term (current) use of aspirin: Secondary | ICD-10-CM | POA: Diagnosis not present

## 2015-12-28 DIAGNOSIS — Z794 Long term (current) use of insulin: Secondary | ICD-10-CM | POA: Diagnosis not present

## 2016-01-25 HISTORY — PX: EYE SURGERY: SHX253

## 2016-02-01 DIAGNOSIS — E784 Other hyperlipidemia: Secondary | ICD-10-CM | POA: Diagnosis not present

## 2016-02-01 DIAGNOSIS — N183 Chronic kidney disease, stage 3 (moderate): Secondary | ICD-10-CM | POA: Diagnosis not present

## 2016-02-01 DIAGNOSIS — E1140 Type 2 diabetes mellitus with diabetic neuropathy, unspecified: Secondary | ICD-10-CM | POA: Diagnosis not present

## 2016-02-01 DIAGNOSIS — I060 Rheumatic aortic stenosis: Secondary | ICD-10-CM | POA: Diagnosis not present

## 2016-03-02 DIAGNOSIS — Z7982 Long term (current) use of aspirin: Secondary | ICD-10-CM | POA: Diagnosis not present

## 2016-03-02 DIAGNOSIS — N182 Chronic kidney disease, stage 2 (mild): Secondary | ICD-10-CM | POA: Diagnosis not present

## 2016-03-02 DIAGNOSIS — E103593 Type 1 diabetes mellitus with proliferative diabetic retinopathy without macular edema, bilateral: Secondary | ICD-10-CM | POA: Diagnosis not present

## 2016-03-02 DIAGNOSIS — E1022 Type 1 diabetes mellitus with diabetic chronic kidney disease: Secondary | ICD-10-CM | POA: Diagnosis not present

## 2016-03-02 DIAGNOSIS — Z7902 Long term (current) use of antithrombotics/antiplatelets: Secondary | ICD-10-CM | POA: Diagnosis not present

## 2016-03-02 DIAGNOSIS — E7800 Pure hypercholesterolemia, unspecified: Secondary | ICD-10-CM | POA: Diagnosis not present

## 2016-03-02 DIAGNOSIS — E11649 Type 2 diabetes mellitus with hypoglycemia without coma: Secondary | ICD-10-CM | POA: Diagnosis not present

## 2016-03-02 DIAGNOSIS — I25810 Atherosclerosis of coronary artery bypass graft(s) without angina pectoris: Secondary | ICD-10-CM | POA: Diagnosis not present

## 2016-03-02 DIAGNOSIS — Z833 Family history of diabetes mellitus: Secondary | ICD-10-CM | POA: Diagnosis not present

## 2016-03-02 DIAGNOSIS — I252 Old myocardial infarction: Secondary | ICD-10-CM | POA: Diagnosis not present

## 2016-03-02 DIAGNOSIS — Z79899 Other long term (current) drug therapy: Secondary | ICD-10-CM | POA: Diagnosis not present

## 2016-03-02 DIAGNOSIS — Z951 Presence of aortocoronary bypass graft: Secondary | ICD-10-CM | POA: Diagnosis not present

## 2016-03-02 DIAGNOSIS — E10649 Type 1 diabetes mellitus with hypoglycemia without coma: Secondary | ICD-10-CM | POA: Diagnosis not present

## 2016-03-02 DIAGNOSIS — Z8249 Family history of ischemic heart disease and other diseases of the circulatory system: Secondary | ICD-10-CM | POA: Diagnosis not present

## 2016-06-01 DIAGNOSIS — M5440 Lumbago with sciatica, unspecified side: Secondary | ICD-10-CM | POA: Diagnosis not present

## 2016-06-01 DIAGNOSIS — I060 Rheumatic aortic stenosis: Secondary | ICD-10-CM | POA: Diagnosis not present

## 2016-06-01 DIAGNOSIS — G629 Polyneuropathy, unspecified: Secondary | ICD-10-CM | POA: Diagnosis not present

## 2016-06-01 DIAGNOSIS — R55 Syncope and collapse: Secondary | ICD-10-CM | POA: Diagnosis not present

## 2016-06-02 DIAGNOSIS — I10 Essential (primary) hypertension: Secondary | ICD-10-CM | POA: Diagnosis not present

## 2016-06-02 DIAGNOSIS — E119 Type 2 diabetes mellitus without complications: Secondary | ICD-10-CM | POA: Diagnosis not present

## 2016-06-02 DIAGNOSIS — E784 Other hyperlipidemia: Secondary | ICD-10-CM | POA: Diagnosis not present

## 2016-06-02 DIAGNOSIS — R5381 Other malaise: Secondary | ICD-10-CM | POA: Diagnosis not present

## 2016-08-10 DIAGNOSIS — H35043 Retinal micro-aneurysms, unspecified, bilateral: Secondary | ICD-10-CM | POA: Diagnosis not present

## 2016-08-10 DIAGNOSIS — E103553 Type 1 diabetes mellitus with stable proliferative diabetic retinopathy, bilateral: Secondary | ICD-10-CM | POA: Diagnosis not present

## 2016-08-10 DIAGNOSIS — H43811 Vitreous degeneration, right eye: Secondary | ICD-10-CM | POA: Diagnosis not present

## 2016-08-10 DIAGNOSIS — H0230 Blepharochalasis unspecified eye, unspecified eyelid: Secondary | ICD-10-CM | POA: Diagnosis not present

## 2016-08-30 ENCOUNTER — Ambulatory Visit: Payer: Medicare Other | Admitting: General Surgery

## 2016-08-31 ENCOUNTER — Inpatient Hospital Stay: Payer: Self-pay

## 2016-08-31 ENCOUNTER — Ambulatory Visit (INDEPENDENT_AMBULATORY_CARE_PROVIDER_SITE_OTHER): Payer: Medicare Other | Admitting: General Surgery

## 2016-08-31 ENCOUNTER — Encounter: Payer: Self-pay | Admitting: General Surgery

## 2016-08-31 VITALS — BP 124/68 | HR 84 | Resp 14 | Ht 64.0 in | Wt 152.6 lb

## 2016-08-31 DIAGNOSIS — N6323 Unspecified lump in the left breast, lower outer quadrant: Secondary | ICD-10-CM | POA: Diagnosis not present

## 2016-08-31 DIAGNOSIS — Z1231 Encounter for screening mammogram for malignant neoplasm of breast: Secondary | ICD-10-CM | POA: Diagnosis not present

## 2016-08-31 DIAGNOSIS — R55 Syncope and collapse: Secondary | ICD-10-CM | POA: Diagnosis not present

## 2016-08-31 DIAGNOSIS — N6019 Diffuse cystic mastopathy of unspecified breast: Secondary | ICD-10-CM | POA: Diagnosis not present

## 2016-08-31 DIAGNOSIS — G629 Polyneuropathy, unspecified: Secondary | ICD-10-CM | POA: Diagnosis not present

## 2016-08-31 DIAGNOSIS — I060 Rheumatic aortic stenosis: Secondary | ICD-10-CM | POA: Diagnosis not present

## 2016-08-31 NOTE — Progress Notes (Signed)
Patient ID: Gail Franco, female   DOB: 08/22/1955, 61 y.o.   MRN: [id number]  Chief Complaint  Patient presents with  . Follow-up    mammogram    HPI Gail Franco is a 61 y.o. female who presents for a breast evaluation. The most recent mammogram was done on 08/30/16. Patient does perform regular self breast checks and gets regular mammograms done.   She reports that she has had some irritation in her laminectomy scar. It has been sore for 3-4 weeks.  Hx of MI s/p coronary bypass graft many yrs ago, stable since  HPI  Past Medical History:  Diagnosis Date  . Arthritis   . Diabetes mellitus without complication (HCC)   . Diffuse cystic mastopathy   . Myocardial infarction (HCC)   . Thyroid disease 2008   hyperactive    Past Surgical History:  Procedure Laterality Date  . ABDOMINAL HYSTERECTOMY  1992  . APPENDECTOMY    . BREAST BIOPSY  1992  . CARDIAC CATHETERIZATION  2010  . CARPAL TUNNEL RELEASE    . COLONOSCOPY  2010  . CORONARY ARTERY BYPASS GRAFT  1995  . EYE SURGERY Left    cataract  . lumbar fusions  2010  . OTHER SURGICAL HISTORY  2011   scar revision d/t fistula lumbar fusion  . SHOULDER SURGERY    . UPPER GI ENDOSCOPY  2010, 2011    History reviewed. No pertinent family history.  Social History Social History  Substance Use Topics  . Smoking status: Never Smoker  . Smokeless tobacco: Never Used  . Alcohol use No    Allergies  Allergen Reactions  . Other Itching    Steri strips     Current Outpatient Prescriptions  Medication Sig Dispense Refill  . acetaminophen (TYLENOL) 500 MG tablet Take 500 mg by mouth every 6 (six) hours as needed for pain.    Marland Kitchen. aspirin EC 81 MG tablet Take 81 mg by mouth daily.    . carvedilol (COREG) 12.5 MG tablet Take 1 tablet by mouth 2 (two) times daily with a meal. 6.25mg     . clopidogrel (PLAVIX) 75 MG tablet Take 75 mg by mouth daily.    . cyclobenzaprine (FLEXERIL) 10 MG tablet Take 10 mg by mouth daily.    Marland Kitchen.  FLUoxetine (PROZAC) 20 MG capsule Take by mouth 2 (two) times daily.     Marland Kitchen. HUMALOG 100 UNIT/ML injection     . LANTUS 100 UNIT/ML injection     . lisinopril (PRINIVIL,ZESTRIL) 2.5 MG tablet Take 2.5 mg by mouth daily.    . pantoprazole (PROTONIX) 40 MG tablet Take 40 mg by mouth daily.    . rosuvastatin (CRESTOR) 20 MG tablet Take 20 mg by mouth at bedtime.    . topiramate (TOPAMAX) 50 MG tablet Take 50 mg by mouth 2 (two) times daily.     No current facility-administered medications for this visit.     Review of Systems Review of Systems  Constitutional: Negative.   Respiratory: Negative.   Cardiovascular: Negative.     Blood pressure 124/68, pulse 84, resp. rate 14, height 5\' 4"  (1.626 m), weight 152 lb 9.6 oz (69.2 kg).  Physical Exam Physical Exam  Constitutional: She is oriented to person, place, and time. She appears well-developed and well-nourished.  Eyes: Conjunctivae are normal. No scleral icterus.  Neck: Neck supple.  Cardiovascular: Normal rate and regular rhythm.   Murmur heard.  Systolic murmur is present with a grade of 3/6  Pulmonary/Chest: Effort normal and breath sounds normal. Right breast exhibits no inverted nipple, no mass, no nipple discharge, no skin change and no tenderness. Left breast exhibits mass. Left breast exhibits no inverted nipple, no nipple discharge, no skin change and no tenderness. Breasts are symmetrical.    Abdominal: Soft. Bowel sounds are normal. There is no tenderness.  Lymphadenopathy:    She has no cervical adenopathy.    She has no axillary adenopathy.  Neurological: She is alert and oriented to person, place, and time.  Skin: Skin is warm and dry.  2 cm blister-like area at the base of remote laminectomy scar at the level of the iliac crest in the lumbosacral region. Slight tenderness to palpation, no central opening or drainage.   Data Reviewed Prior notes  Targeted ultrasound of the left breast mas 5 to 6:00 location  approximately 4 cm from the nipple was performed. No abnormality of the underlying tissues identified  This is likely a lipoma.  Assessment    Fibrocystic breast disease - 1.5-2 cm soft, mobile mass noted at the 6 o clock position in left breast. Likely a lipoma Pain at site of remote surgical incision- pt has been experiencing pain at site of laminectomy scar for 3-4 weeks now. Pt is on Plavix, will discontinue for a few days before returning to office to open the blister-likely this is a suture granuloma    Plan Stop Plavix for 5 days and return for exploration of the small blisterlike area at the laminectomy site  [place] of left breast and axilla performed in office, shows no abnormalities.      Return in 2 months for breast re-evaluation.     HPI, Physical Exam, Assessment and Plan have been scribed under the direction and in the presence of Kathreen Cosier, MD  Carron Brazen, LPN I have completed the exam and reviewed the above documentation for accuracy and completeness.  I agree with the above.  Museum/gallery conservator has been used and any errors in dictation or transcription are unintentional.  Meridee Branum G. Evette Cristal, M.D., F.A.C.S.  Gerlene Burdock G 09/01/2016, 8:41 AM

## 2016-08-31 NOTE — Patient Instructions (Signed)
Follow-up in 2 months for breast re-evaluation.  Follow-up in a couple of weeks for opening of surgical scar, will need to dc Plavix 5 days prior.

## 2016-09-06 ENCOUNTER — Encounter: Payer: Self-pay | Admitting: General Surgery

## 2016-09-14 ENCOUNTER — Ambulatory Visit: Payer: Medicare Other | Admitting: General Surgery

## 2016-09-28 DIAGNOSIS — N182 Chronic kidney disease, stage 2 (mild): Secondary | ICD-10-CM | POA: Diagnosis not present

## 2016-09-28 DIAGNOSIS — E11649 Type 2 diabetes mellitus with hypoglycemia without coma: Secondary | ICD-10-CM | POA: Diagnosis not present

## 2016-09-28 DIAGNOSIS — I2510 Atherosclerotic heart disease of native coronary artery without angina pectoris: Secondary | ICD-10-CM | POA: Diagnosis not present

## 2016-09-28 DIAGNOSIS — R809 Proteinuria, unspecified: Secondary | ICD-10-CM | POA: Diagnosis not present

## 2016-09-28 DIAGNOSIS — Z6825 Body mass index (BMI) 25.0-25.9, adult: Secondary | ICD-10-CM | POA: Diagnosis not present

## 2016-09-28 DIAGNOSIS — E10649 Type 1 diabetes mellitus with hypoglycemia without coma: Secondary | ICD-10-CM | POA: Diagnosis not present

## 2016-09-28 DIAGNOSIS — Z794 Long term (current) use of insulin: Secondary | ICD-10-CM | POA: Diagnosis not present

## 2016-09-28 DIAGNOSIS — E663 Overweight: Secondary | ICD-10-CM | POA: Diagnosis not present

## 2016-09-28 DIAGNOSIS — E7800 Pure hypercholesterolemia, unspecified: Secondary | ICD-10-CM | POA: Diagnosis not present

## 2016-09-28 DIAGNOSIS — Z79899 Other long term (current) drug therapy: Secondary | ICD-10-CM | POA: Diagnosis not present

## 2016-09-28 DIAGNOSIS — I25810 Atherosclerosis of coronary artery bypass graft(s) without angina pectoris: Secondary | ICD-10-CM | POA: Diagnosis not present

## 2016-09-28 DIAGNOSIS — Z833 Family history of diabetes mellitus: Secondary | ICD-10-CM | POA: Diagnosis not present

## 2016-09-28 DIAGNOSIS — E103599 Type 1 diabetes mellitus with proliferative diabetic retinopathy without macular edema, unspecified eye: Secondary | ICD-10-CM | POA: Diagnosis not present

## 2016-09-28 DIAGNOSIS — Z7982 Long term (current) use of aspirin: Secondary | ICD-10-CM | POA: Diagnosis not present

## 2016-09-28 DIAGNOSIS — E1022 Type 1 diabetes mellitus with diabetic chronic kidney disease: Secondary | ICD-10-CM | POA: Diagnosis not present

## 2016-09-28 DIAGNOSIS — D649 Anemia, unspecified: Secondary | ICD-10-CM | POA: Diagnosis not present

## 2016-09-28 DIAGNOSIS — E103593 Type 1 diabetes mellitus with proliferative diabetic retinopathy without macular edema, bilateral: Secondary | ICD-10-CM | POA: Diagnosis not present

## 2016-11-02 ENCOUNTER — Encounter: Payer: Self-pay | Admitting: General Surgery

## 2016-11-02 ENCOUNTER — Ambulatory Visit (INDEPENDENT_AMBULATORY_CARE_PROVIDER_SITE_OTHER): Payer: Medicare Other | Admitting: General Surgery

## 2016-11-02 VITALS — BP 140/78 | HR 72 | Resp 12 | Ht 64.0 in | Wt 154.0 lb

## 2016-11-02 DIAGNOSIS — N6019 Diffuse cystic mastopathy of unspecified breast: Secondary | ICD-10-CM

## 2016-11-02 NOTE — Progress Notes (Signed)
Patient ID: Gail Franco, female   DOB: Jul 16, 1955, 61 y.o.   MRN: [id number]  Chief Complaint  Patient presents with  . Follow-up    HPI Gail Franco is a 61 y.o. female here today for her follow up breast mass recall. Patient states she doesn't feel any mass in left breast.   HPI  Past Medical History:  Diagnosis Date  . Arthritis   . Diabetes mellitus without complication (HCC)   . Diffuse cystic mastopathy   . Myocardial infarction (HCC)   . Thyroid disease 2008   hyperactive    Past Surgical History:  Procedure Laterality Date  . ABDOMINAL HYSTERECTOMY  1992  . APPENDECTOMY    . BREAST BIOPSY  1992  . CARDIAC CATHETERIZATION  2010  . CARPAL TUNNEL RELEASE    . COLONOSCOPY  2010  . CORONARY ARTERY BYPASS GRAFT  1995  . EYE SURGERY Left    cataract  . lumbar fusions  2010  . OTHER SURGICAL HISTORY  2011   scar revision d/t fistula lumbar fusion  . SHOULDER SURGERY    . UPPER GI ENDOSCOPY  2010, 2011    No family history on file.  Social History Social History  Substance Use Topics  . Smoking status: Never Smoker  . Smokeless tobacco: Never Used  . Alcohol use No    Allergies  Allergen Reactions  . Other Itching    Steri strips     Current Outpatient Prescriptions  Medication Sig Dispense Refill  . acetaminophen (TYLENOL) 500 MG tablet Take 500 mg by mouth every 6 (six) hours as needed for pain.    Marland Kitchen aspirin EC 81 MG tablet Take 81 mg by mouth daily.    . carvedilol (COREG) 12.5 MG tablet Take 1 tablet by mouth 2 (two) times daily with a meal. 6.25mg     . clopidogrel (PLAVIX) 75 MG tablet Take 75 mg by mouth daily.    . cyclobenzaprine (FLEXERIL) 10 MG tablet Take 10 mg by mouth daily.    Marland Kitchen FLUoxetine (PROZAC) 20 MG capsule Take by mouth 2 (two) times daily.     Marland Kitchen HUMALOG 100 UNIT/ML injection     . LANTUS 100 UNIT/ML injection     . lisinopril (PRINIVIL,ZESTRIL) 2.5 MG tablet Take 2.5 mg by mouth daily.    . pantoprazole (PROTONIX) 40 MG tablet  Take 40 mg by mouth daily.    . rosuvastatin (CRESTOR) 20 MG tablet Take 20 mg by mouth at bedtime.    . topiramate (TOPAMAX) 50 MG tablet Take 50 mg by mouth 2 (two) times daily.     No current facility-administered medications for this visit.     Review of Systems Review of Systems  Constitutional: Negative.   Respiratory: Negative.   Cardiovascular: Negative.     Blood pressure 140/78, pulse 72, resp. rate 12, height  (1.626 m), weight 154 lb (69.9 kg).  Physical Exam Physical Exam  Constitutional: She is oriented to person, place, and time. She appears well-developed and well-nourished.  Pulmonary/Chest: Right breast exhibits no inverted nipple, no mass, no nipple discharge, no skin change and no tenderness. Left breast exhibits no inverted nipple, no mass, no nipple discharge, no skin change and no tenderness.  Lymphadenopathy:    She has no cervical adenopathy.    She has no axillary adenopathy.  Neurological: She is alert and oriented to person, place, and time.  Skin: Skin is warm and dry.  Psychiatric: She has a normal mood and  affect. Her behavior is normal.  Prior lipomatous mass in left breast is no longer palpable.  Data Reviewed Prior notes and recent mammogram reviewed - stable  Assessment    FCD - stable. Last physical exam on 09/01/16 showed 1.5-2 cm soft mobile mass at 6 o'clock position in left breast. This was not palpable on physical exam today and it appears to have resolved.    Plan    Return to clinic as needed. Recommend annual mammogram and follow up with Dr. Juel Burrow to monitor.     HPI, Physical Exam, Assessment and Plan have been scribed under the direction and in the presence of Kathreen Cosier, MD  Ples Specter, CMA I have completed the exam and reviewed the above documentation for accuracy and completeness.  I agree with the above.  Museum/gallery conservator has been used and any errors in dictation or transcription are unintentional.  Bartosz Luginbill  G. Evette Cristal, M.D., F.A.C.S.  Gerlene Burdock G 11/02/2016, 1:37 PM

## 2016-11-02 NOTE — Patient Instructions (Addendum)
Return to clinic as needed. Recommend annual mammogram and follow up with Dr. Juel Burrow to monitor.

## 2016-11-30 DIAGNOSIS — J3081 Allergic rhinitis due to animal (cat) (dog) hair and dander: Secondary | ICD-10-CM | POA: Diagnosis not present

## 2016-11-30 DIAGNOSIS — G629 Polyneuropathy, unspecified: Secondary | ICD-10-CM | POA: Diagnosis not present

## 2016-11-30 DIAGNOSIS — I060 Rheumatic aortic stenosis: Secondary | ICD-10-CM | POA: Diagnosis not present

## 2017-01-30 ENCOUNTER — Other Ambulatory Visit (HOSPITAL_COMMUNITY): Payer: Self-pay | Admitting: Neurological Surgery

## 2017-01-30 DIAGNOSIS — M545 Low back pain: Secondary | ICD-10-CM

## 2017-01-31 ENCOUNTER — Ambulatory Visit (HOSPITAL_COMMUNITY)
Admission: RE | Admit: 2017-01-31 | Discharge: 2017-01-31 | Disposition: A | Discharge status: Home / Self Care | Payer: Medicare Other | Source: Ambulatory Visit | Attending: Neurological Surgery | Admitting: Neurological Surgery

## 2017-01-31 DIAGNOSIS — M545 Low back pain: Secondary | ICD-10-CM | POA: Diagnosis not present

## 2017-01-31 DIAGNOSIS — M5134 Other intervertebral disc degeneration, thoracic region: Secondary | ICD-10-CM | POA: Diagnosis not present

## 2017-01-31 DIAGNOSIS — M48061 Spinal stenosis, lumbar region without neurogenic claudication: Secondary | ICD-10-CM | POA: Insufficient documentation

## 2017-01-31 DIAGNOSIS — M4316 Spondylolisthesis, lumbar region: Secondary | ICD-10-CM | POA: Insufficient documentation

## 2017-01-31 DIAGNOSIS — T8130XA Disruption of wound, unspecified, initial encounter: Secondary | ICD-10-CM | POA: Diagnosis not present

## 2017-01-31 DIAGNOSIS — M549 Dorsalgia, unspecified: Secondary | ICD-10-CM | POA: Diagnosis not present

## 2017-01-31 LAB — CREATININE, SERUM
Creatinine, Ser: 1.11 mg/dL — ABNORMAL HIGH (ref 0.44–1.00)
GFR calc non Af Amer: 52 mL/min — ABNORMAL LOW (ref >60)
GFR, EST AFRICAN AMERICAN: 60 mL/min — AB (ref >60)

## 2017-01-31 MED ORDER — GADOBENATE DIMEGLUMINE 529 MG/ML IV SOLN
15.0000 mL | Freq: Once | INTRAVENOUS | Status: AC | PRN
  Administered 2017-01-31: 15 mL via INTRAVENOUS

## 2017-02-08 ENCOUNTER — Encounter (HOSPITAL_COMMUNITY): Payer: Self-pay | Admitting: *Deleted

## 2017-02-08 ENCOUNTER — Other Ambulatory Visit: Payer: Self-pay

## 2017-02-08 NOTE — Progress Notes (Signed)
Spoke with pt for pre-op call. Pt has hx of CAD with CABG in 1995. She denies any recent chest pain or sob. Pt [place]. She states she sees him every 3 months. I have requested last office visit notes, EKG tracing, cath report, Echo and/or stress reports from his office. Pt's last dose of Plavix was 02/02/17. She states Dr. Yetta BarreJones told her to continue her Aspirin. Pt is a type 1 diabetic. She states her last A1C was 6.9 or 7.0 in September. She states her fasting blood sugar can be anywhere from 60- 112 depending on what she eats the night before. Instructed pt to take 80% of her regular dose of Lantus tonight and again in the AM. Instructed her to check her blood sugar in the AM when she gets up and every 2 hours before leaving for the hospital. Pt lives 2 hours away from hospital so I suggested she bring her CBG meter with her to check it on the way. If blood sugar is >220 take 1/2 of usual correction dose of Humalog insulin. If blood sugar is 70 or below, treat with 1/2 cup of clear juice (apple or cranberry) and recheck blood sugar 15 minutes after drinking juice. If blood sugar continues to be 70 or below, call the Short Stay department and ask to speak to a nurse. She voiced understanding.

## 2017-02-09 ENCOUNTER — Encounter (HOSPITAL_COMMUNITY)
Admission: RE | Disposition: A | Discharge status: Home / Self Care | Payer: Self-pay | Source: Ambulatory Visit | Attending: Neurological Surgery

## 2017-02-09 ENCOUNTER — Encounter (HOSPITAL_COMMUNITY): Payer: Self-pay | Admitting: Urology

## 2017-02-09 ENCOUNTER — Ambulatory Visit (HOSPITAL_COMMUNITY): Payer: Medicare Other | Admitting: Emergency Medicine

## 2017-02-09 ENCOUNTER — Ambulatory Visit (HOSPITAL_COMMUNITY)
Admission: RE | Admit: 2017-02-09 | Discharge: 2017-02-09 | Disposition: A | Discharge status: Home / Self Care | Payer: Medicare Other | Source: Ambulatory Visit | Attending: Neurological Surgery | Admitting: Neurological Surgery

## 2017-02-09 DIAGNOSIS — Z91048 Other nonmedicinal substance allergy status: Secondary | ICD-10-CM | POA: Diagnosis not present

## 2017-02-09 DIAGNOSIS — M1990 Unspecified osteoarthritis, unspecified site: Secondary | ICD-10-CM | POA: Diagnosis not present

## 2017-02-09 DIAGNOSIS — T8131XA Disruption of external operation (surgical) wound, not elsewhere classified, initial encounter: Secondary | ICD-10-CM | POA: Diagnosis not present

## 2017-02-09 DIAGNOSIS — Z7902 Long term (current) use of antithrombotics/antiplatelets: Secondary | ICD-10-CM | POA: Diagnosis not present

## 2017-02-09 DIAGNOSIS — T84498A Other mechanical complication of other internal orthopedic devices, implants and grafts, initial encounter: Secondary | ICD-10-CM | POA: Diagnosis not present

## 2017-02-09 DIAGNOSIS — Y838 Other surgical procedures as the cause of abnormal reaction of the patient, or of later complication, without mention of misadventure at the time of the procedure: Secondary | ICD-10-CM | POA: Insufficient documentation

## 2017-02-09 DIAGNOSIS — L988 Other specified disorders of the skin and subcutaneous tissue: Secondary | ICD-10-CM | POA: Insufficient documentation

## 2017-02-09 DIAGNOSIS — T84296A Other mechanical complication of internal fixation device of vertebrae, initial encounter: Secondary | ICD-10-CM | POA: Diagnosis not present

## 2017-02-09 DIAGNOSIS — T8132XA Disruption of internal operation (surgical) wound, not elsewhere classified, initial encounter: Secondary | ICD-10-CM | POA: Insufficient documentation

## 2017-02-09 DIAGNOSIS — T84226A Displacement of internal fixation device of vertebrae, initial encounter: Secondary | ICD-10-CM | POA: Diagnosis not present

## 2017-02-09 DIAGNOSIS — T8130XA Disruption of wound, unspecified, initial encounter: Secondary | ICD-10-CM | POA: Diagnosis present

## 2017-02-09 DIAGNOSIS — I252 Old myocardial infarction: Secondary | ICD-10-CM | POA: Diagnosis not present

## 2017-02-09 DIAGNOSIS — L7682 Other postprocedural complications of skin and subcutaneous tissue: Secondary | ICD-10-CM | POA: Insufficient documentation

## 2017-02-09 DIAGNOSIS — Z794 Long term (current) use of insulin: Secondary | ICD-10-CM | POA: Diagnosis not present

## 2017-02-09 DIAGNOSIS — Z7982 Long term (current) use of aspirin: Secondary | ICD-10-CM | POA: Insufficient documentation

## 2017-02-09 DIAGNOSIS — E119 Type 2 diabetes mellitus without complications: Secondary | ICD-10-CM | POA: Insufficient documentation

## 2017-02-09 DIAGNOSIS — T8183XA Persistent postprocedural fistula, initial encounter: Secondary | ICD-10-CM | POA: Insufficient documentation

## 2017-02-09 DIAGNOSIS — Z79899 Other long term (current) drug therapy: Secondary | ICD-10-CM | POA: Diagnosis not present

## 2017-02-09 HISTORY — DX: Other specified postprocedural states: Z98.890

## 2017-02-09 HISTORY — PX: LUMBAR WOUND DEBRIDEMENT: SHX1988

## 2017-02-09 HISTORY — DX: Nausea with vomiting, unspecified: R11.2

## 2017-02-09 LAB — GLUCOSE, CAPILLARY
GLUCOSE-CAPILLARY: 114 mg/dL — AB (ref 65–99)
Glucose-Capillary: 125 mg/dL — ABNORMAL HIGH (ref 65–99)
Glucose-Capillary: 119 mg/dL — ABNORMAL HIGH (ref 65–99)

## 2017-02-09 LAB — BASIC METABOLIC PANEL
Anion gap: 13 (ref 5–15)
BUN: 25 mg/dL — AB (ref 6–20)
CHLORIDE: 108 mmol/L (ref 101–111)
CO2: 18 mmol/L — ABNORMAL LOW (ref 22–32)
Calcium: 9.9 mg/dL (ref 8.9–10.3)
Creatinine, Ser: 1.03 mg/dL — ABNORMAL HIGH (ref 0.44–1.00)
GFR calc Af Amer: >60 mL/min (ref >60)
GFR calc non Af Amer: 57 mL/min — ABNORMAL LOW (ref >60)
GLUCOSE: 118 mg/dL — AB (ref 65–99)
POTASSIUM: 4.9 mmol/L (ref 3.5–5.1)
Sodium: 139 mmol/L (ref 135–145)

## 2017-02-09 LAB — CBC
HEMATOCRIT: 40.6 % (ref 36.0–46.0)
Hemoglobin: 13.1 g/dL (ref 12.0–15.0)
MCH: 31.2 pg (ref 26.0–34.0)
MCHC: 32.3 g/dL (ref 30.0–36.0)
MCV: 96.7 fL (ref 78.0–100.0)
Platelets: 272 10*3/uL (ref 150–400)
RBC: 4.2 MIL/uL (ref 3.87–5.11)
RDW: 12.8 % (ref 11.5–15.5)
WBC: 6.9 10*3/uL (ref 4.0–10.5)

## 2017-02-09 LAB — HEMOGLOBIN A1C
Hgb A1c MFr Bld: 6.8 % — ABNORMAL HIGH (ref 4.8–5.6)
Mean Plasma Glucose: 148.46 mg/dL

## 2017-02-09 SURGERY — LUMBAR WOUND DEBRIDEMENT
Anesthesia: General | Site: Spine Lumbar

## 2017-02-09 MED ORDER — OXYCODONE-ACETAMINOPHEN 7.5-325 MG PO TABS: 1.0000 | ORAL_TABLET | Freq: Four times a day (QID) | ORAL | 0 refills | Status: DC | PRN

## 2017-02-09 MED ORDER — PROPOFOL 10 MG/ML IV BOLUS
INTRAVENOUS | Status: DC | PRN
  Administered 2017-02-09: 130 mg via INTRAVENOUS

## 2017-02-09 MED ORDER — LACTATED RINGERS IV SOLN
INTRAVENOUS | Status: DC
  Administered 2017-02-09: 10:00:00 via INTRAVENOUS

## 2017-02-09 MED ORDER — MIDAZOLAM HCL 2 MG/2ML IJ SOLN
INTRAMUSCULAR | Status: DC | PRN
  Administered 2017-02-09 (×2): 1 mg via INTRAVENOUS

## 2017-02-09 MED ORDER — CYCLOBENZAPRINE HCL 10 MG PO TABS
ORAL_TABLET | ORAL | Status: AC
  Filled 2017-02-09: qty 1

## 2017-02-09 MED ORDER — VANCOMYCIN HCL 1000 MG IV SOLR
INTRAVENOUS | Status: DC | PRN
  Administered 2017-02-09: 1000 mg via TOPICAL

## 2017-02-09 MED ORDER — HYDROMORPHONE HCL 1 MG/ML IJ SOLN
0.2500 mg | INTRAMUSCULAR | Status: DC | PRN
  Administered 2017-02-09 (×2): 0.5 mg via INTRAVENOUS

## 2017-02-09 MED ORDER — LIDOCAINE 2% (20 MG/ML) 5 ML SYRINGE
INTRAMUSCULAR | Status: DC | PRN
  Administered 2017-02-09: 100 mg via INTRAVENOUS

## 2017-02-09 MED ORDER — CYCLOBENZAPRINE HCL 10 MG PO TABS
10.0000 mg | ORAL_TABLET | Freq: Every day | ORAL | Status: DC
  Administered 2017-02-09: 10 mg via ORAL

## 2017-02-09 MED ORDER — FENTANYL CITRATE (PF) 250 MCG/5ML IJ SOLN
INTRAMUSCULAR | Status: AC
  Filled 2017-02-09: qty 5

## 2017-02-09 MED ORDER — BUPIVACAINE HCL (PF) 0.25 % IJ SOLN
INTRAMUSCULAR | Status: DC | PRN
  Administered 2017-02-09: 10 mL

## 2017-02-09 MED ORDER — PROPOFOL 10 MG/ML IV BOLUS
INTRAVENOUS | Status: AC
  Filled 2017-02-09: qty 20

## 2017-02-09 MED ORDER — OXYCODONE-ACETAMINOPHEN 7.5-325 MG PO TABS
1.0000 | ORAL_TABLET | Freq: Once | ORAL | Status: AC
  Administered 2017-02-09: 1 via ORAL
  Filled 2017-02-09: qty 1

## 2017-02-09 MED ORDER — FENTANYL CITRATE (PF) 250 MCG/5ML IJ SOLN
INTRAMUSCULAR | Status: DC | PRN
  Administered 2017-02-09: 150 ug via INTRAVENOUS

## 2017-02-09 MED ORDER — ONDANSETRON HCL 4 MG/2ML IJ SOLN
4.0000 mg | Freq: Once | INTRAMUSCULAR | Status: DC | PRN
Start: 2017-02-09 — End: 2017-02-09

## 2017-02-09 MED ORDER — ONDANSETRON HCL 4 MG/2ML IJ SOLN
INTRAMUSCULAR | Status: DC | PRN
  Administered 2017-02-09: 4 mg via INTRAVENOUS

## 2017-02-09 MED ORDER — SUGAMMADEX SODIUM 200 MG/2ML IV SOLN
INTRAVENOUS | Status: DC | PRN
  Administered 2017-02-09: 150 mg via INTRAVENOUS

## 2017-02-09 MED ORDER — BUPIVACAINE HCL (PF) 0.25 % IJ SOLN
INTRAMUSCULAR | Status: AC
  Filled 2017-02-09: qty 30

## 2017-02-09 MED ORDER — VANCOMYCIN HCL 1000 MG IV SOLR
INTRAVENOUS | Status: AC
  Filled 2017-02-09: qty 1000

## 2017-02-09 MED ORDER — CEFAZOLIN SODIUM-DEXTROSE 2-3 GM-%(50ML) IV SOLR
INTRAVENOUS | Status: DC | PRN
  Administered 2017-02-09: 2 g via INTRAVENOUS

## 2017-02-09 MED ORDER — 0.9 % SODIUM CHLORIDE (POUR BTL) OPTIME
TOPICAL | Status: DC | PRN
  Administered 2017-02-09: 1000 mL

## 2017-02-09 MED ORDER — THROMBIN (RECOMBINANT) 5000 UNITS EX SOLR
CUTANEOUS | Status: AC
  Filled 2017-02-09: qty 15000

## 2017-02-09 MED ORDER — MIDAZOLAM HCL 2 MG/2ML IJ SOLN
INTRAMUSCULAR | Status: AC
  Filled 2017-02-09: qty 2

## 2017-02-09 MED ORDER — SODIUM CHLORIDE 0.9 % IR SOLN
Status: DC | PRN
  Administered 2017-02-09: 14:00:00

## 2017-02-09 MED ORDER — DEXAMETHASONE SODIUM PHOSPHATE 10 MG/ML IJ SOLN
INTRAMUSCULAR | Status: DC | PRN
  Administered 2017-02-09: 10 mg via INTRAVENOUS

## 2017-02-09 MED ORDER — KETOROLAC TROMETHAMINE 30 MG/ML IJ SOLN
INTRAMUSCULAR | Status: DC | PRN
  Administered 2017-02-09: 30 mg via INTRAVENOUS

## 2017-02-09 MED ORDER — MEPERIDINE HCL 25 MG/ML IJ SOLN: 6.2500 mg | INTRAMUSCULAR | Status: DC | PRN

## 2017-02-09 MED ORDER — HYDROMORPHONE HCL 1 MG/ML IJ SOLN
INTRAMUSCULAR | Status: AC
  Filled 2017-02-09: qty 1

## 2017-02-09 MED ORDER — ROCURONIUM BROMIDE 10 MG/ML (PF) SYRINGE
PREFILLED_SYRINGE | INTRAVENOUS | Status: DC | PRN
  Administered 2017-02-09: 50 mg via INTRAVENOUS

## 2017-02-09 SURGICAL SUPPLY — 46 items
BAG DECANTER FOR FLEXI CONT (MISCELLANEOUS) ×3 IMPLANT
BENZOIN TINCTURE PRP APPL 2/3 (GAUZE/BANDAGES/DRESSINGS) IMPLANT
CANISTER SUCT 3000ML PPV (MISCELLANEOUS) ×3 IMPLANT
CARTRIDGE OIL MAESTRO DRILL (MISCELLANEOUS) IMPLANT
CLOSURE WOUND 1/2 X4 (GAUZE/BANDAGES/DRESSINGS)
DERMABOND ADVANCED (GAUZE/BANDAGES/DRESSINGS) ×2
DERMABOND ADVANCED .7 DNX12 (GAUZE/BANDAGES/DRESSINGS) ×1 IMPLANT
DIFFUSER DRILL AIR PNEUMATIC (MISCELLANEOUS) IMPLANT
DRAPE LAPAROTOMY 100X72X124 (DRAPES) ×3 IMPLANT
DRAPE POUCH INSTRU U-SHP 10X18 (DRAPES) ×3 IMPLANT
DRSG OPSITE POSTOP 4X6 (GAUZE/BANDAGES/DRESSINGS) ×3 IMPLANT
DURAPREP 26ML APPLICATOR (WOUND CARE) ×3 IMPLANT
DURAPREP 6ML APPLICATOR 50/CS (WOUND CARE) IMPLANT
ELECT REM PT RETURN 9FT ADLT (ELECTROSURGICAL) ×3
ELECTRODE REM PT RTRN 9FT ADLT (ELECTROSURGICAL) ×1 IMPLANT
GAUZE SPONGE 4X4 16PLY XRAY LF (GAUZE/BANDAGES/DRESSINGS) IMPLANT
GLOVE BIO SURGEON STRL SZ7 (GLOVE) ×3 IMPLANT
GLOVE BIO SURGEON STRL SZ8 (GLOVE) ×6 IMPLANT
GLOVE BIOGEL PI IND STRL 7.0 (GLOVE) ×2 IMPLANT
GLOVE BIOGEL PI IND STRL 7.5 (GLOVE) ×2 IMPLANT
GLOVE BIOGEL PI INDICATOR 7.0 (GLOVE) ×4
GLOVE BIOGEL PI INDICATOR 7.5 (GLOVE) ×4
GOWN STRL REUS W/ TWL LRG LVL3 (GOWN DISPOSABLE) ×1 IMPLANT
GOWN STRL REUS W/ TWL XL LVL3 (GOWN DISPOSABLE) ×1 IMPLANT
GOWN STRL REUS W/TWL 2XL LVL3 (GOWN DISPOSABLE) IMPLANT
GOWN STRL REUS W/TWL LRG LVL3 (GOWN DISPOSABLE) ×2
GOWN STRL REUS W/TWL XL LVL3 (GOWN DISPOSABLE) ×2
KIT BASIN OR (CUSTOM PROCEDURE TRAY) ×3 IMPLANT
KIT ROOM TURNOVER OR (KITS) ×3 IMPLANT
NEEDLE HYPO 18GX1.5 BLUNT FILL (NEEDLE) IMPLANT
NEEDLE HYPO 25X1 1.5 SAFETY (NEEDLE) ×3 IMPLANT
NEEDLE SPNL 20GX3.5 QUINCKE YW (NEEDLE) IMPLANT
NS IRRIG 1000ML POUR BTL (IV SOLUTION) ×3 IMPLANT
OIL CARTRIDGE MAESTRO DRILL (MISCELLANEOUS)
PACK LAMINECTOMY NEURO (CUSTOM PROCEDURE TRAY) ×3 IMPLANT
PAD ARMBOARD 7.5X6 YLW CONV (MISCELLANEOUS) ×9 IMPLANT
STRIP CLOSURE SKIN 1/2X4 (GAUZE/BANDAGES/DRESSINGS) IMPLANT
SUT VIC AB 0 CT1 18XCR BRD8 (SUTURE) ×1 IMPLANT
SUT VIC AB 0 CT1 8-18 (SUTURE) ×2
SUT VIC AB 2-0 CP2 18 (SUTURE) ×3 IMPLANT
SUT VIC AB 3-0 SH 8-18 (SUTURE) ×3 IMPLANT
SWAB COLLECTION DEVICE MRSA (MISCELLANEOUS) IMPLANT
SYR 3ML LL SCALE MARK (SYRINGE) IMPLANT
TOWEL GREEN STERILE (TOWEL DISPOSABLE) ×3 IMPLANT
TOWEL GREEN STERILE FF (TOWEL DISPOSABLE) ×3 IMPLANT
WATER STERILE IRR 1000ML POUR (IV SOLUTION) ×3 IMPLANT

## 2017-02-09 NOTE — Anesthesia Preprocedure Evaluation (Signed)
Anesthesia Evaluation  Patient identified by MRN, date of birth, ID band Patient awake    Reviewed: Allergy & Precautions, NPO status , Patient's Chart, lab work & pertinent test results  History of Anesthesia Complications (+) PONV  Airway Mallampati: I  TM Distance: >3 FB Neck ROM: Full    Dental   Pulmonary    Pulmonary exam normal        Cardiovascular + Past MI  Normal cardiovascular exam     Neuro/Psych    GI/Hepatic   Endo/Other  diabetes, Well Controlled, Type 1, Insulin Dependent  Renal/GU      Musculoskeletal   Abdominal   Peds  Hematology   Anesthesia Other Findings   Reproductive/Obstetrics                             Anesthesia Physical Anesthesia Plan  ASA: III  Anesthesia Plan: General   Post-op Pain Management:    Induction: Intravenous  PONV Risk Score and Plan: 4 or greater and Scopolamine patch - Pre-op, Ondansetron, Dexamethasone and Midazolam  Airway Management Planned: Oral ETT  Additional Equipment:   Intra-op Plan:   Post-operative Plan: Extubation in OR  Informed Consent: I have reviewed the patients History and Physical, chart, labs and discussed the procedure including the risks, benefits and alternatives for the proposed anesthesia with the patient or authorized representative who has indicated his/her understanding and acceptance.     Plan Discussed with: CRNA and Surgeon  Anesthesia Plan Comments:         Anesthesia Quick Evaluation

## 2017-02-09 NOTE — Anesthesia Procedure Notes (Signed)
Procedure Name: Intubation Date/Time: 02/09/2017 12:59 PM Performed by: Julieta Bellini, CRNA Pre-anesthesia Checklist: Patient identified, Emergency Drugs available, Suction available and Patient being monitored Patient Re-evaluated:Patient Re-evaluated prior to induction Oxygen Delivery Method: Circle system utilized Preoxygenation: Pre-oxygenation with 100% oxygen Induction Type: IV induction Ventilation: Mask ventilation without difficulty Laryngoscope Size: Mac and 3 Grade View: Grade I Tube type: Oral Tube size: 7.0 mm Number of attempts: 1 Airway Equipment and Method: Stylet Placement Confirmation: ETT inserted through vocal cords under direct vision,  positive ETCO2 and breath sounds checked- equal and bilateral Secured at: 22 cm Tube secured with: Tape Dental Injury: Teeth and Oropharynx as per pre-operative assessment

## 2017-02-09 NOTE — Progress Notes (Signed)
Anesthesia Chart Review:  Pt is a same day work up.   Pt is a 62 year old female scheduled for lumbar wound revision and removal of affix plate on 1/61/09601/17/2019 with Marikay Alaravid Jones, MD  - Cardiologist is Corky DownsJaved Masoud, MD who has cleared pt for surgery.   PMH includes:  CAD (s/p CABG 1995), HTN, DM, post-op N/V. Never smoker.   Medications include: ASA 81 mg, carvedilol, Plavix, Humalog, Lantus, lisinopril, Protonix, rosuvastatin.  Last dose plavix 01/1017.   Labs will be obtained day of surgery.   EKG will be obtained day of surgery.   Last office visit note and any cardiac testing has been requested from Dr. Fredna DowMasoud's office.   Pt will need further assessment by assigned anesthesiologist day of surgery.   Rica Mastngela Polette Nofsinger, FNP-BC [facility]Anesthesiology Phone: [phone] 02/09/2017 9:33 AM

## 2017-02-09 NOTE — Anesthesia Postprocedure Evaluation (Signed)
Anesthesia Post Note  Patient: Asencion IslamLynne H Beissel  Procedure(s) Performed: LUMBAR WOUND REVISION WITH REMOVAL OF PALTE (N/A Spine Lumbar)     Patient location during evaluation: PACU Anesthesia Type: General Level of consciousness: awake and alert, patient cooperative and oriented Pain management: pain level controlled (pt feeling better) Vital Signs Assessment: post-procedure vital signs reviewed and stable Respiratory status: spontaneous breathing, nonlabored ventilation and respiratory function stable Cardiovascular status: blood pressure returned to baseline and stable Postop Assessment: no apparent nausea or vomiting Anesthetic complications: no    Last Vitals:  Vitals:   02/09/17 1515 02/09/17 1521  BP:  121/73  Pulse: 81 79  Resp: (!) 26 (!) 24  Temp:    SpO2: 97% 99%    Last Pain:  Vitals:   02/09/17 1514  TempSrc:   PainSc: 3     LLE Motor Response: Purposeful movement;Responds to commands (02/09/17 1521) LLE Sensation: Full sensation (02/09/17 1521) RLE Motor Response: Purposeful movement;Responds to commands (02/09/17 1521) RLE Sensation: Full sensation (02/09/17 1521)      Erling CruzJACKSON,E. Ericca Labra

## 2017-02-09 NOTE — Transfer of Care (Signed)
Immediate Anesthesia Transfer of Care Note  Patient: Gail Franco  Procedure(s) Performed: LUMBAR WOUND REVISION WITH REMOVAL OF PALTE (N/A Spine Lumbar)  Patient Location: PACU  Anesthesia Type:General  Level of Consciousness: awake, alert , oriented and patient cooperative  Airway & Oxygen Therapy: Patient Spontanous Breathing and Patient connected to nasal cannula oxygen  Post-op Assessment: Report given to RN, Post -op Vital signs reviewed and stable and Patient moving all extremities X 4  Post vital signs: Reviewed and stable  Last Vitals:  Vitals:   02/09/17 0931  BP: 131/76  Pulse: 75  Resp: 18  Temp: (!) 36.4 C  SpO2: 99%    Last Pain:  Vitals:   02/09/17 0931  TempSrc: Oral      Patients Stated Pain Goal: 5 (02/09/17 0950)  Complications: No apparent anesthesia complications

## 2017-02-09 NOTE — Op Note (Signed)
02/09/2017  1:54 PM  PATIENT:  Gail Franco  62 y.o. 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 Lumbar reexploration with excision of fistulous tract from skin to hardware measuring probably 8-10 cm in length, 2. Removal of nonsegmental fixation, 3. Complex closure of lumbar wound after irrigation and debridement with undermining of fascia measuring 4 x 6 cm to decrease wound tension and allow primary closure  SURGEON:  Sherley Bounds, MD  ASSISTANTS: None  ANESTHESIA:   General  EBL: Less than 50 ml  No intake/output data recorded.  BLOOD ADMINISTERED: none  DRAINS: None  SPECIMEN:  none  INDICATION FOR PROCEDURE: This patient presented with a wound process with a fistulous tract. This was 10 years after a lumbar fusion with placement of hardware. Recommended lumbar reexploration with resection of the fistulous tract and removal of the instrumentation.. Patient understood the risks, benefits, and alternatives and potential outcomes and wished to proceed.  PROCEDURE DETAILS: The patient was taken to the operating room and after induction of adequate general endotracheal anesthesia lumbar region was cleaned with Betadine scrub and then DuraPrep and then draped in usual sterile fashion. The fistula measured about 1 cm by 1 cm. I performed an elliptical incision and carried this down the fistulous tract through the fascia , measuring 8-10 cm,  until it met the lumbar instrumentation. I then infiltrated the fistula. I removed the lumbar instrumentation which was loose and simply sitting in the subfascial region. The instrumentation was removed. The wound was debrided and then irrigated with copious massive by stressing any saline solution. There was no obvious evidence of infection such as purulence. I then closed the fascia, after placing vancomycin powder, with 0  Vicryl. I then the tissues with a suprafascial incision measuring about 4-6 cm in length and 4 cm in width in order to reduce tension on the tissues. I then closed the subcutaneous tissues with 2-0 Vicryl. Closed the subcuticular tissue with 3-0 Vicryl. The skin was closed with Dermabond. A sterile dressing was applied. The patient was then awakened from general anesthesia and transferred to the recovery room in stable condition. At the end of the procedure all sponge and needle and instrument counts were correct.   PLAN OF CARE: Discharge to home after PACU  PATIENT DISPOSITION:  PACU - hemodynamically stable.   Delay start of Pharmacological VTE agent (>24hrs) due to surgical blood loss or risk of bleeding:  yes

## 2017-02-09 NOTE — H&P (Signed)
Subjective: Patient is a 62 y.o. female admitted for wound drainage. Onset of symptoms was several months ago, gradually worsening since that time.  The pain is rated mild if any, and is located at the across the lower back. The pain is described as aching and occurs intermittently. The symptoms have been progressive. Symptoms are exacerbated by nothing in particular. MRI or CT showed previous fusion.   Past Medical History:  Diagnosis Date  . Arthritis   . Diabetes mellitus without complication (HCC)    Type 1  . Diffuse cystic mastopathy   . Myocardial infarction (HCC)   . PONV (postoperative nausea and vomiting)    always gets nauseated, needs Zofran  . Thyroid disease 2008   no longer on medications    Past Surgical History:  Procedure Laterality Date  . ABDOMINAL HYSTERECTOMY  1992  . APPENDECTOMY    . BREAST BIOPSY  1992  . CARDIAC CATHETERIZATION  2010  . CARPAL TUNNEL RELEASE    . COLONOSCOPY  2010  . CORONARY ARTERY BYPASS GRAFT  1995  . EYE SURGERY Left    cataract with lens implant  . EYE SURGERY Right 2018   cataract with lens implant  . lumbar fusions  2010  . OTHER SURGICAL HISTORY  2011   scar revision d/t fistula lumbar fusion  . SHOULDER SURGERY    . UPPER GI ENDOSCOPY  2010, 2011    Prior to Admission medications   Medication Sig Start Date End Date Taking? Authorizing Provider  acetaminophen (TYLENOL) 500 MG tablet Take 500 mg by mouth every 6 (six) hours as needed for pain.   Yes [provider]  aspirin EC 81 MG tablet Take 81 mg by mouth daily.   Yes [provider]  carvedilol (COREG) 6.25 MG tablet Take 1 tablet by mouth 2 (two) times daily with a meal.  07/15/13  Yes [provider]  cyclobenzaprine (FLEXERIL) 10 MG tablet Take 10 mg by mouth at bedtime.  04/12/12  Yes [provider]  FLUoxetine (PROZAC) 20 MG capsule Take 20 mg by mouth 2 (two) times daily.  06/25/12  Yes [provider]  HUMALOG 100 UNIT/ML  injection Inject into the skin 3 (three) times daily with meals. Per sliding scale 04/24/12  Yes [provider]  LANTUS 100 UNIT/ML injection Inject 4-12 Units into the skin See admin instructions. 4 units in the morning, 8-12 units in the evening 04/12/12  Yes [provider]  lisinopril (PRINIVIL,ZESTRIL) 2.5 MG tablet Take 2.5 mg by mouth at bedtime.  05/29/12  Yes [provider]  pantoprazole (PROTONIX) 40 MG tablet Take 40 mg by mouth every morning.  06/25/12  Yes [provider]  rosuvastatin (CRESTOR) 10 MG tablet Take 10 mg by mouth at bedtime.    Yes [provider]  topiramate (TOPAMAX) 50 MG tablet Take 50 mg by mouth 2 (two) times daily. 07/10/12  Yes [provider]  clopidogrel (PLAVIX) 75 MG tablet Take 75 mg by mouth daily. 05/18/12   [provider]   Allergies  Allergen Reactions  . Other Itching    Steri strips     Social History   Tobacco Use  . Smoking status: Never Smoker  . Smokeless tobacco: Never Used  Substance Use Topics  . Alcohol use: No    Family History  Problem Relation Age of Onset  . Atrial fibrillation Mother   . Heart disease Father      Review of Systems  Positive ROS: neg  All other systems have been reviewed and were otherwise negative with the exception of those mentioned in the HPI and as above.  Objective: Vital signs in last 24 hours: Temp:  [97.5 F (36.4 C)] 97.5 F (36.4 C) (01/17 0931) Pulse Rate:  [75] 75 (01/17 0931) Resp:  [18] 18 (01/17 0931) BP: (131)/(76) 131/76 (01/17 0931) SpO2:  [99 %] 99 % (01/17 0931) Weight:  [68 kg (150 lb)] 68 kg (150 lb) (01/17 0931)  General Appearance: Alert, cooperative, no distress, appears stated age Head: Normocephalic, without obvious abnormality, atraumatic Eyes: PERRL, conjunctiva/corneas clear, EOM's intact    Neck: Supple, symmetrical, trachea midline Back: Symmetric, no curvature, ROM normal, no CVA tenderness Lungs:   respirations unlabored Heart: Regular rate and rhythm Abdomen: Soft, non-tender Extremities: Extremities normal, atraumatic, no cyanosis or edema Pulses: 2+ and symmetric all extremities Skin: Skin color, texture, turgor normal, no rashes or lesions  NEUROLOGIC:   Mental status: Alert and oriented x4,  no aphasia, good attention span, fund of knowledge, and memory Motor Exam - grossly normal Sensory Exam - grossly normal Reflexes: 1+ Coordination - grossly normal Gait - grossly normal Balance - grossly normal Cranial Nerves: I: smell Not tested  II: visual acuity  OS: nl    OD: nl  II: visual fields Full to confrontation  II: pupils Equal, round, reactive to light  III,VII: ptosis None  III,IV,VI: extraocular muscles  Full ROM  V: mastication Normal  V: facial light touch sensation  Normal  V,VII: corneal reflex  Present  VII: facial muscle function - upper  Normal  VII: facial muscle function - lower Normal  VIII: hearing Not tested  IX: soft palate elevation  Normal  IX,X: gag reflex Present  XI: trapezius strength  5/5  XI: sternocleidomastoid strength 5/5  XI: neck flexion strength  5/5  XII: tongue strength  Normal    Data Review Lab Results  Component Value Date   WBC 6.9 02/09/2017   HGB 13.1 02/09/2017   HCT 40.6 02/09/2017   MCV 96.7 02/09/2017   PLT 272 02/09/2017   Lab Results  Component Value Date   NA 139 02/09/2017   K 4.9 02/09/2017   CL 108 02/09/2017   CO2 18 (L) 02/09/2017   BUN 25 (H) 02/09/2017   CREATININE 1.03 (H) 02/09/2017   GLUCOSE 118 (H) 02/09/2017   Lab Results  Component Value Date   INR 1.05 01/27/2009    Assessment/Plan: Patient admitted for lumbar wound I&D and removal of instrumentation. Patient has failed a reasonable attempt at conservative therapy.  I explained the condition and procedure to the patient and answered any questions.  Patient wishes to proceed with procedure as planned. Understands risks/ benefits and  typical outcomes of procedure.   Darrion Wyszynski S 02/09/2017 12:11 PM

## 2017-02-10 ENCOUNTER — Encounter (HOSPITAL_COMMUNITY): Payer: Self-pay | Admitting: Neurological Surgery

## 2017-02-21 DIAGNOSIS — I5033 Acute on chronic diastolic (congestive) heart failure: Secondary | ICD-10-CM | POA: Diagnosis not present

## 2017-02-21 DIAGNOSIS — K219 Gastro-esophageal reflux disease without esophagitis: Secondary | ICD-10-CM | POA: Diagnosis not present

## 2017-02-21 DIAGNOSIS — M961 Postlaminectomy syndrome, not elsewhere classified: Secondary | ICD-10-CM | POA: Diagnosis not present

## 2017-02-21 DIAGNOSIS — E119 Type 2 diabetes mellitus without complications: Secondary | ICD-10-CM | POA: Diagnosis not present

## 2017-04-10 DIAGNOSIS — E103593 Type 1 diabetes mellitus with proliferative diabetic retinopathy without macular edema, bilateral: Secondary | ICD-10-CM | POA: Diagnosis not present

## 2017-04-10 DIAGNOSIS — E11649 Type 2 diabetes mellitus with hypoglycemia without coma: Secondary | ICD-10-CM | POA: Diagnosis not present

## 2017-04-10 DIAGNOSIS — E1022 Type 1 diabetes mellitus with diabetic chronic kidney disease: Secondary | ICD-10-CM | POA: Diagnosis not present

## 2017-04-10 DIAGNOSIS — N182 Chronic kidney disease, stage 2 (mild): Secondary | ICD-10-CM | POA: Diagnosis not present

## 2017-04-10 DIAGNOSIS — R809 Proteinuria, unspecified: Secondary | ICD-10-CM | POA: Diagnosis not present

## 2017-04-10 DIAGNOSIS — E10649 Type 1 diabetes mellitus with hypoglycemia without coma: Secondary | ICD-10-CM | POA: Diagnosis not present

## 2017-04-10 DIAGNOSIS — I2510 Atherosclerotic heart disease of native coronary artery without angina pectoris: Secondary | ICD-10-CM | POA: Diagnosis not present

## 2017-04-10 DIAGNOSIS — Z7982 Long term (current) use of aspirin: Secondary | ICD-10-CM | POA: Diagnosis not present

## 2017-04-10 DIAGNOSIS — I25810 Atherosclerosis of coronary artery bypass graft(s) without angina pectoris: Secondary | ICD-10-CM | POA: Diagnosis not present

## 2017-04-10 DIAGNOSIS — Z951 Presence of aortocoronary bypass graft: Secondary | ICD-10-CM | POA: Diagnosis not present

## 2017-04-17 ENCOUNTER — Encounter: Payer: Self-pay | Admitting: General Surgery

## 2017-04-18 ENCOUNTER — Encounter: Payer: Self-pay | Admitting: General Surgery

## 2017-05-10 DIAGNOSIS — K219 Gastro-esophageal reflux disease without esophagitis: Secondary | ICD-10-CM | POA: Diagnosis not present

## 2017-05-10 DIAGNOSIS — E119 Type 2 diabetes mellitus without complications: Secondary | ICD-10-CM | POA: Diagnosis not present

## 2017-05-10 DIAGNOSIS — I060 Rheumatic aortic stenosis: Secondary | ICD-10-CM | POA: Diagnosis not present

## 2017-05-10 DIAGNOSIS — M961 Postlaminectomy syndrome, not elsewhere classified: Secondary | ICD-10-CM | POA: Diagnosis not present

## 2017-07-05 DIAGNOSIS — H43811 Vitreous degeneration, right eye: Secondary | ICD-10-CM | POA: Diagnosis not present

## 2017-07-05 DIAGNOSIS — H35043 Retinal micro-aneurysms, unspecified, bilateral: Secondary | ICD-10-CM | POA: Diagnosis not present

## 2017-07-05 DIAGNOSIS — H0230 Blepharochalasis unspecified eye, unspecified eyelid: Secondary | ICD-10-CM | POA: Diagnosis not present

## 2017-07-05 DIAGNOSIS — E103553 Type 1 diabetes mellitus with stable proliferative diabetic retinopathy, bilateral: Secondary | ICD-10-CM | POA: Diagnosis not present

## 2017-08-09 DIAGNOSIS — I060 Rheumatic aortic stenosis: Secondary | ICD-10-CM | POA: Diagnosis not present

## 2017-08-09 DIAGNOSIS — E119 Type 2 diabetes mellitus without complications: Secondary | ICD-10-CM | POA: Diagnosis not present

## 2017-08-09 DIAGNOSIS — K219 Gastro-esophageal reflux disease without esophagitis: Secondary | ICD-10-CM | POA: Diagnosis not present

## 2017-08-09 DIAGNOSIS — M961 Postlaminectomy syndrome, not elsewhere classified: Secondary | ICD-10-CM | POA: Diagnosis not present

## 2017-10-15 DIAGNOSIS — J069 Acute upper respiratory infection, unspecified: Secondary | ICD-10-CM | POA: Diagnosis not present

## 2017-10-15 DIAGNOSIS — J45909 Unspecified asthma, uncomplicated: Secondary | ICD-10-CM | POA: Diagnosis not present

## 2017-10-15 DIAGNOSIS — J40 Bronchitis, not specified as acute or chronic: Secondary | ICD-10-CM | POA: Diagnosis not present

## 2017-10-25 DIAGNOSIS — E1022 Type 1 diabetes mellitus with diabetic chronic kidney disease: Secondary | ICD-10-CM | POA: Diagnosis not present

## 2017-10-25 DIAGNOSIS — I060 Rheumatic aortic stenosis: Secondary | ICD-10-CM | POA: Diagnosis not present

## 2017-10-25 DIAGNOSIS — E103593 Type 1 diabetes mellitus with proliferative diabetic retinopathy without macular edema, bilateral: Secondary | ICD-10-CM | POA: Diagnosis not present

## 2017-10-25 DIAGNOSIS — E119 Type 2 diabetes mellitus without complications: Secondary | ICD-10-CM | POA: Diagnosis not present

## 2017-10-25 DIAGNOSIS — K219 Gastro-esophageal reflux disease without esophagitis: Secondary | ICD-10-CM | POA: Diagnosis not present

## 2017-10-25 DIAGNOSIS — M961 Postlaminectomy syndrome, not elsewhere classified: Secondary | ICD-10-CM | POA: Diagnosis not present

## 2017-10-25 DIAGNOSIS — I2510 Atherosclerotic heart disease of native coronary artery without angina pectoris: Secondary | ICD-10-CM | POA: Diagnosis not present

## 2017-10-25 DIAGNOSIS — I25810 Atherosclerosis of coronary artery bypass graft(s) without angina pectoris: Secondary | ICD-10-CM | POA: Diagnosis not present

## 2017-10-25 DIAGNOSIS — R809 Proteinuria, unspecified: Secondary | ICD-10-CM | POA: Diagnosis not present

## 2017-10-25 DIAGNOSIS — I252 Old myocardial infarction: Secondary | ICD-10-CM | POA: Diagnosis not present

## 2017-10-25 DIAGNOSIS — Z7982 Long term (current) use of aspirin: Secondary | ICD-10-CM | POA: Diagnosis not present

## 2017-10-25 DIAGNOSIS — N182 Chronic kidney disease, stage 2 (mild): Secondary | ICD-10-CM | POA: Diagnosis not present

## 2017-10-25 DIAGNOSIS — E663 Overweight: Secondary | ICD-10-CM | POA: Diagnosis not present

## 2017-10-25 DIAGNOSIS — Z79899 Other long term (current) drug therapy: Secondary | ICD-10-CM | POA: Diagnosis not present

## 2017-10-25 DIAGNOSIS — E10649 Type 1 diabetes mellitus with hypoglycemia without coma: Secondary | ICD-10-CM | POA: Diagnosis not present

## 2017-10-25 DIAGNOSIS — Z7902 Long term (current) use of antithrombotics/antiplatelets: Secondary | ICD-10-CM | POA: Diagnosis not present

## 2017-10-25 DIAGNOSIS — Z951 Presence of aortocoronary bypass graft: Secondary | ICD-10-CM | POA: Diagnosis not present

## 2017-10-25 DIAGNOSIS — E7800 Pure hypercholesterolemia, unspecified: Secondary | ICD-10-CM | POA: Diagnosis not present

## 2017-10-25 DIAGNOSIS — Z6824 Body mass index (BMI) 24.0-24.9, adult: Secondary | ICD-10-CM | POA: Diagnosis not present

## 2017-10-26 DIAGNOSIS — E119 Type 2 diabetes mellitus without complications: Secondary | ICD-10-CM | POA: Diagnosis not present

## 2017-10-26 DIAGNOSIS — E7849 Other hyperlipidemia: Secondary | ICD-10-CM | POA: Diagnosis not present

## 2017-10-26 DIAGNOSIS — I10 Essential (primary) hypertension: Secondary | ICD-10-CM | POA: Diagnosis not present

## 2017-10-26 DIAGNOSIS — R5381 Other malaise: Secondary | ICD-10-CM | POA: Diagnosis not present

## 2017-11-27 DIAGNOSIS — Z23 Encounter for immunization: Secondary | ICD-10-CM | POA: Diagnosis not present

## 2017-12-27 DIAGNOSIS — E1140 Type 2 diabetes mellitus with diabetic neuropathy, unspecified: Secondary | ICD-10-CM | POA: Diagnosis not present

## 2017-12-27 DIAGNOSIS — Z0000 Encounter for general adult medical examination without abnormal findings: Secondary | ICD-10-CM | POA: Diagnosis not present

## 2017-12-27 DIAGNOSIS — M5440 Lumbago with sciatica, unspecified side: Secondary | ICD-10-CM | POA: Diagnosis not present

## 2017-12-27 DIAGNOSIS — M5127 Other intervertebral disc displacement, lumbosacral region: Secondary | ICD-10-CM | POA: Diagnosis not present

## 2017-12-27 DIAGNOSIS — Z1231 Encounter for screening mammogram for malignant neoplasm of breast: Secondary | ICD-10-CM | POA: Diagnosis not present

## 2017-12-27 DIAGNOSIS — I060 Rheumatic aortic stenosis: Secondary | ICD-10-CM | POA: Diagnosis not present

## 2017-12-29 DIAGNOSIS — E58 Dietary calcium deficiency: Secondary | ICD-10-CM | POA: Diagnosis not present

## 2017-12-29 DIAGNOSIS — R5381 Other malaise: Secondary | ICD-10-CM | POA: Diagnosis not present

## 2018-01-10 DIAGNOSIS — N183 Chronic kidney disease, stage 3 (moderate): Secondary | ICD-10-CM | POA: Diagnosis not present

## 2018-01-10 DIAGNOSIS — I060 Rheumatic aortic stenosis: Secondary | ICD-10-CM | POA: Diagnosis not present

## 2018-01-10 DIAGNOSIS — M5137 Other intervertebral disc degeneration, lumbosacral region: Secondary | ICD-10-CM | POA: Diagnosis not present

## 2018-01-10 DIAGNOSIS — M5440 Lumbago with sciatica, unspecified side: Secondary | ICD-10-CM | POA: Diagnosis not present

## 2018-01-10 DIAGNOSIS — E58 Dietary calcium deficiency: Secondary | ICD-10-CM | POA: Diagnosis not present

## 2018-01-10 DIAGNOSIS — M5127 Other intervertebral disc displacement, lumbosacral region: Secondary | ICD-10-CM | POA: Diagnosis not present

## 2018-01-10 DIAGNOSIS — E876 Hypokalemia: Secondary | ICD-10-CM | POA: Diagnosis not present

## 2018-05-26 IMAGING — MR MR LUMBAR SPINE WO/W CM
4 of 7 series · 19 of 48 positions shown · IV contrast (multihance)
Comparison: Lumbar MRI 12/26/2008

CLINICAL DATA: Low back pain

EXAM:
MRI LUMBAR SPINE WITHOUT AND WITH CONTRAST
TECHNIQUE: Multiplanar and multiecho pulse sequences of the lumbar spine were
obtained without and with intravenous contrast.
CONTRAST:  15mL MULTIHANCE GADOBENATE DIMEGLUMINE 529 MG/ML IV SOLN

[Series 2: T2 · sagittal · 4.0mm · 0.55mm/px · 4 of 13 slices shown (1 of 2)]
[im 1/13]
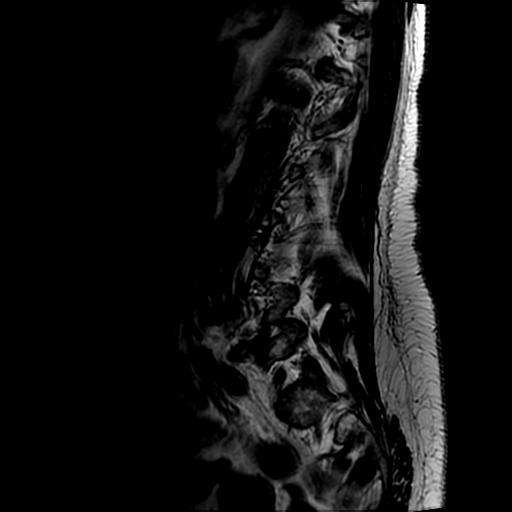
[im 5/13]
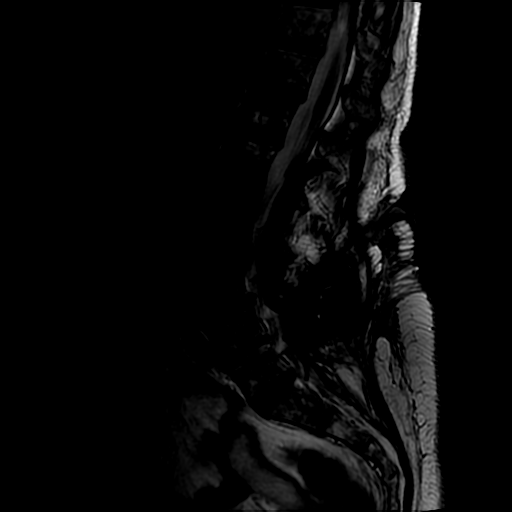
[im 9/13]
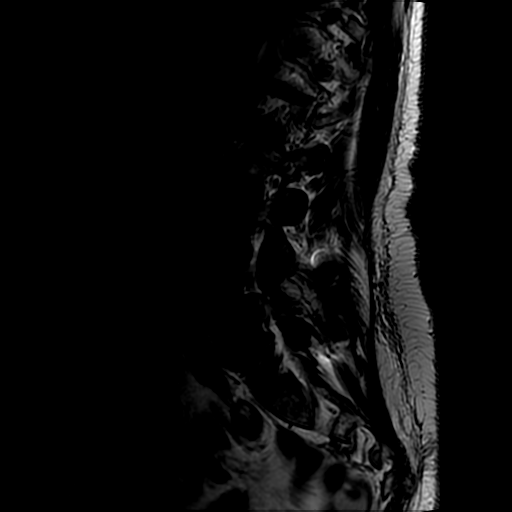
[im 13/13]
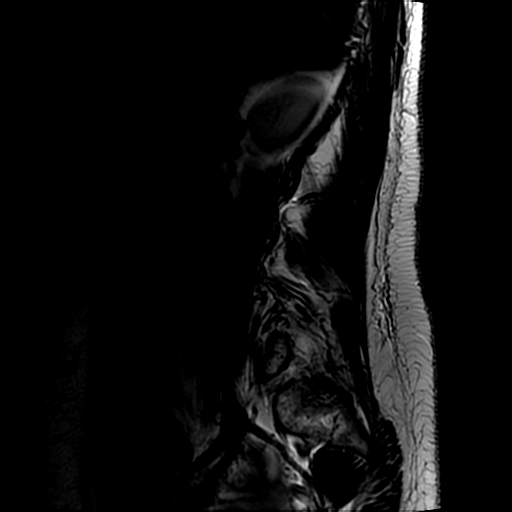

[Series 4: T1 · sagittal · 4.0mm · 0.55mm/px · 3 of 13 slices shown (1 of 2)]
[im 1/13]
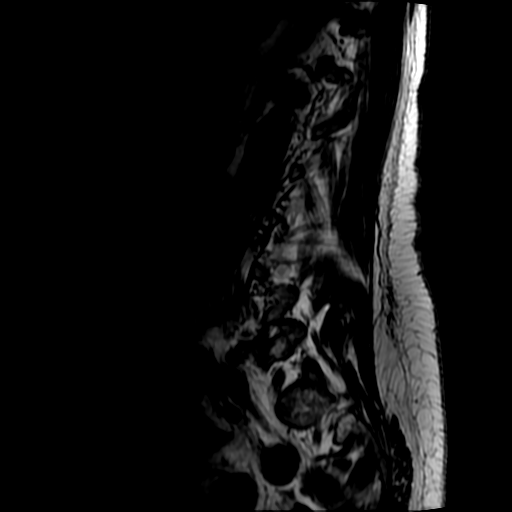
[im 9/13]
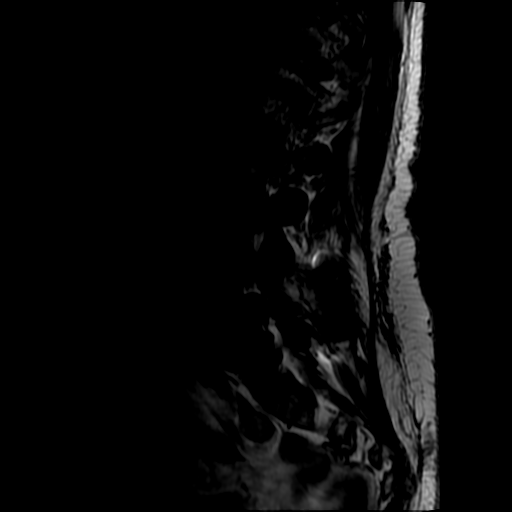
[im 13/13]
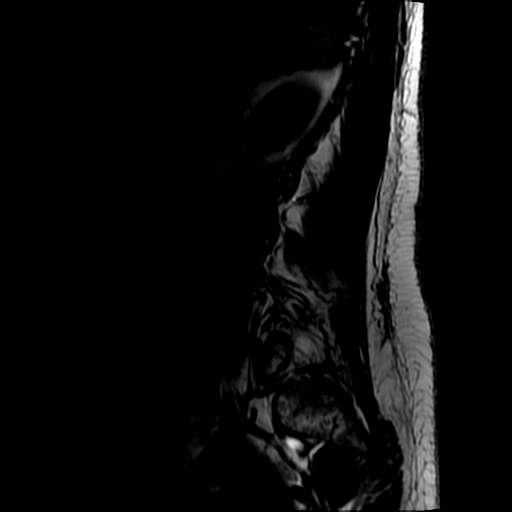

[Series 5: T2 · axial · 4.0mm · 0.39mm/px · z∈[-8,+215]mm · 9 of 44 slices shown (2 of 2)]
[im 1/44]
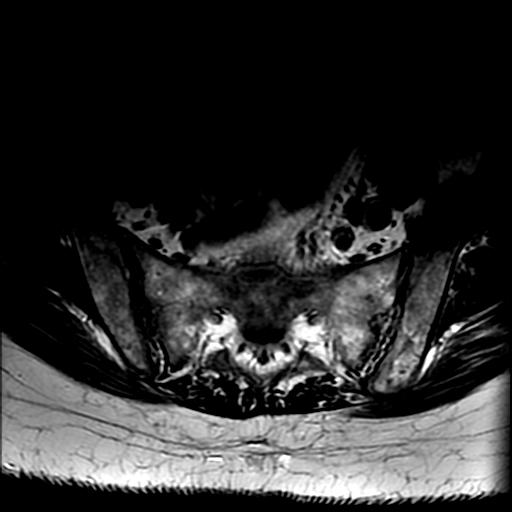
[im 5/44]
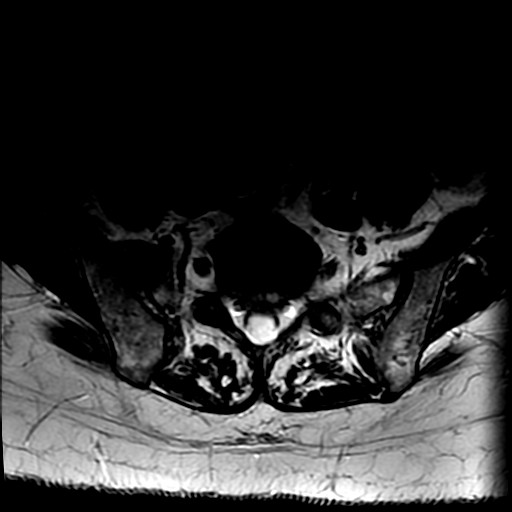
[im 9/44]
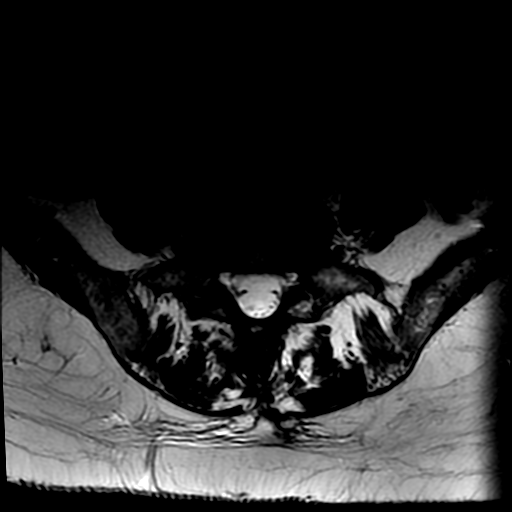
[im 13/44]
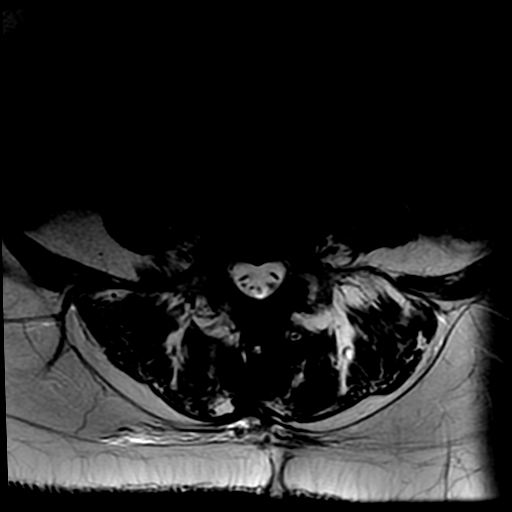
[im 18/44]
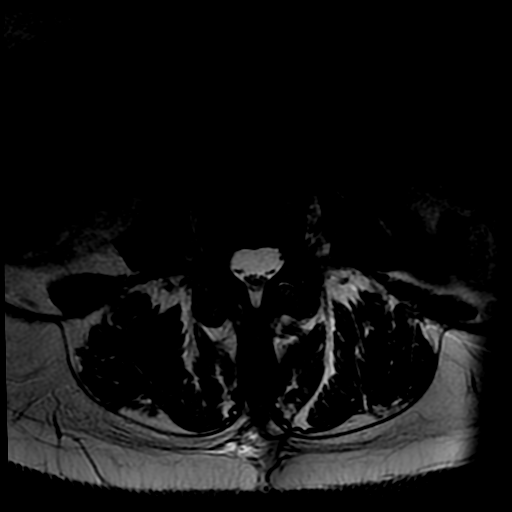
[im 22/44]
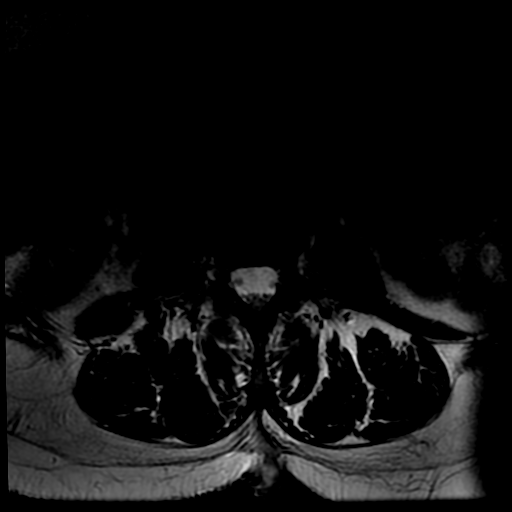
[im 26/44]
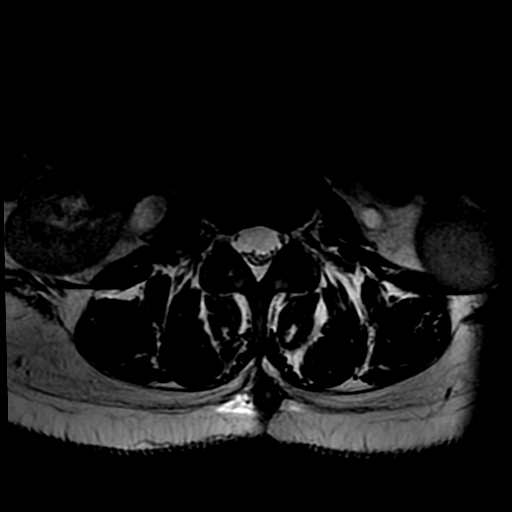
[im 31/44]
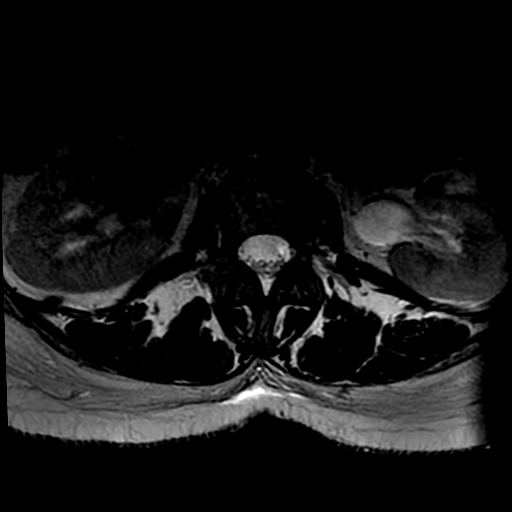
[im 39/44]
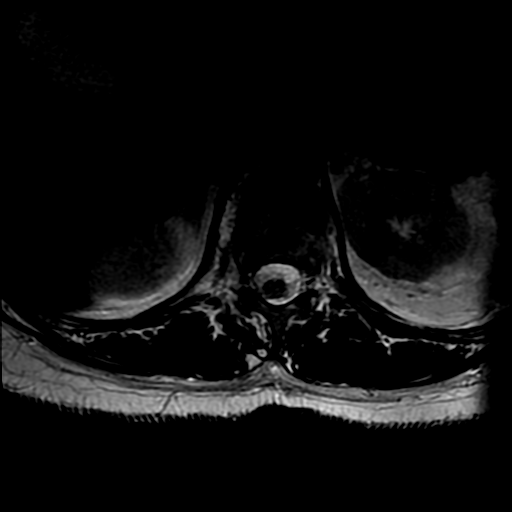

[Series 6: T1 · axial · 4.0mm · 0.39mm/px · z∈[+12,+215]mm · 3 of 44 slices shown (2 of 2)]
[im 5/44]
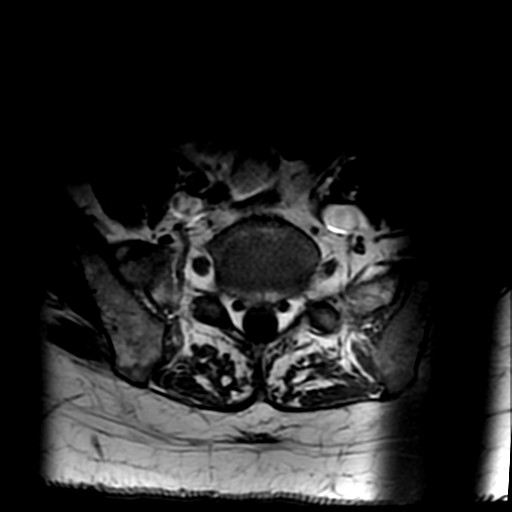
[im 22/44]
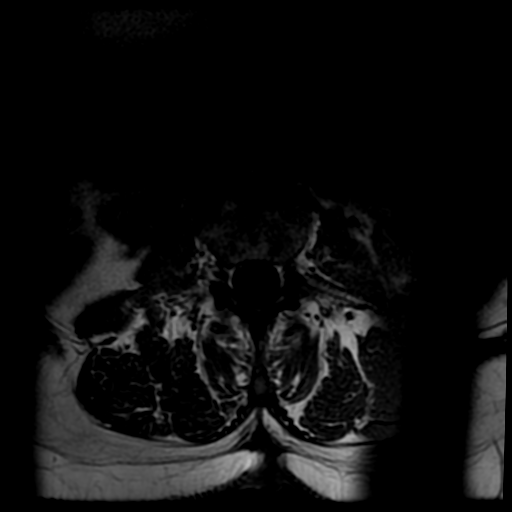
[im 39/44]
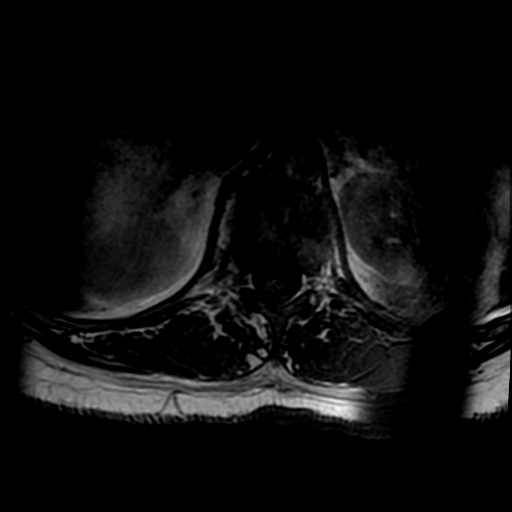

[19 of 48 positions shown; findings below may reference images not displayed]

FINDINGS: Segmentation:  Normal.

Alignment: Mild retrolisthesis L2-3 unchanged. 6 mm anterolisthesis
L3-4 has progressed in the interval

Vertebrae:  Negative for fracture or mass.

Conus medullaris and cauda equina: Conus extends to the T12-L1
level. Conus and cauda equina appear normal.

Paraspinal and other soft tissues: Negative

Disc levels:

T11-12: Advanced disc degeneration with progression. Negative for
stenosis

T12-L1: Mild disc degeneration without stenosis

L1-2: Negative

L2-3: Mild disc and facet degeneration without stenosis

L3-4: 6 mm anterolisthesis with progression. Bilateral facet
hypertrophy. Mild spinal stenosis and mild foraminal stenosis
bilaterally. Interbody fusion with spacer. Posterior fusion with X
stop device on the spinous processes.

L4-5: Moderate disc degeneration with disc space narrowing and mild
spurring. Mild foraminal narrowing bilaterally. Spinal canal
adequate in size.

L5-S1: Negative
IMPRESSION: Progressive disc degeneration T11-12 without stenosis

Progressive anterolisthesis L3-4. Interbody fusion and spinous
process fusion. Mild spinal stenosis and mild foraminal stenosis
bilaterally

Mild foraminal stenosis bilaterally L4-5 due to spurring unchanged.

## 2018-06-13 DIAGNOSIS — H35043 Retinal micro-aneurysms, unspecified, bilateral: Secondary | ICD-10-CM | POA: Diagnosis not present

## 2018-06-13 DIAGNOSIS — H0230 Blepharochalasis unspecified eye, unspecified eyelid: Secondary | ICD-10-CM | POA: Diagnosis not present

## 2018-06-13 DIAGNOSIS — M5137 Other intervertebral disc degeneration, lumbosacral region: Secondary | ICD-10-CM | POA: Diagnosis not present

## 2018-06-13 DIAGNOSIS — E1140 Type 2 diabetes mellitus with diabetic neuropathy, unspecified: Secondary | ICD-10-CM | POA: Diagnosis not present

## 2018-06-13 DIAGNOSIS — H43811 Vitreous degeneration, right eye: Secondary | ICD-10-CM | POA: Diagnosis not present

## 2018-06-13 DIAGNOSIS — E876 Hypokalemia: Secondary | ICD-10-CM | POA: Diagnosis not present

## 2018-06-13 DIAGNOSIS — R5381 Other malaise: Secondary | ICD-10-CM | POA: Diagnosis not present

## 2018-06-13 DIAGNOSIS — N183 Chronic kidney disease, stage 3 (moderate): Secondary | ICD-10-CM | POA: Diagnosis not present

## 2018-06-13 DIAGNOSIS — E103553 Type 1 diabetes mellitus with stable proliferative diabetic retinopathy, bilateral: Secondary | ICD-10-CM | POA: Diagnosis not present

## 2018-06-13 DIAGNOSIS — I060 Rheumatic aortic stenosis: Secondary | ICD-10-CM | POA: Diagnosis not present

## 2018-07-04 DIAGNOSIS — E103593 Type 1 diabetes mellitus with proliferative diabetic retinopathy without macular edema, bilateral: Secondary | ICD-10-CM | POA: Diagnosis not present

## 2018-07-04 DIAGNOSIS — E11649 Type 2 diabetes mellitus with hypoglycemia without coma: Secondary | ICD-10-CM | POA: Diagnosis not present

## 2018-07-04 DIAGNOSIS — E10649 Type 1 diabetes mellitus with hypoglycemia without coma: Secondary | ICD-10-CM | POA: Diagnosis not present

## 2018-07-04 DIAGNOSIS — R809 Proteinuria, unspecified: Secondary | ICD-10-CM | POA: Diagnosis not present

## 2018-07-04 DIAGNOSIS — N182 Chronic kidney disease, stage 2 (mild): Secondary | ICD-10-CM | POA: Diagnosis not present

## 2018-07-04 DIAGNOSIS — E663 Overweight: Secondary | ICD-10-CM | POA: Diagnosis not present

## 2018-07-04 DIAGNOSIS — I25810 Atherosclerosis of coronary artery bypass graft(s) without angina pectoris: Secondary | ICD-10-CM | POA: Diagnosis not present

## 2018-12-14 DIAGNOSIS — E7849 Other hyperlipidemia: Secondary | ICD-10-CM | POA: Diagnosis not present

## 2018-12-14 DIAGNOSIS — R5381 Other malaise: Secondary | ICD-10-CM | POA: Diagnosis not present

## 2018-12-14 DIAGNOSIS — I10 Essential (primary) hypertension: Secondary | ICD-10-CM | POA: Diagnosis not present

## 2018-12-14 DIAGNOSIS — E58 Dietary calcium deficiency: Secondary | ICD-10-CM | POA: Diagnosis not present

## 2018-12-14 DIAGNOSIS — E119 Type 2 diabetes mellitus without complications: Secondary | ICD-10-CM | POA: Diagnosis not present

## 2018-12-14 DIAGNOSIS — E876 Hypokalemia: Secondary | ICD-10-CM | POA: Diagnosis not present

## 2019-02-16 DIAGNOSIS — Z23 Encounter for immunization: Secondary | ICD-10-CM | POA: Diagnosis not present

## 2019-03-04 DIAGNOSIS — I25810 Atherosclerosis of coronary artery bypass graft(s) without angina pectoris: Secondary | ICD-10-CM | POA: Diagnosis not present

## 2019-03-04 DIAGNOSIS — E103593 Type 1 diabetes mellitus with proliferative diabetic retinopathy without macular edema, bilateral: Secondary | ICD-10-CM | POA: Diagnosis not present

## 2019-03-04 DIAGNOSIS — E7800 Pure hypercholesterolemia, unspecified: Secondary | ICD-10-CM | POA: Diagnosis not present

## 2019-03-04 DIAGNOSIS — N182 Chronic kidney disease, stage 2 (mild): Secondary | ICD-10-CM | POA: Diagnosis not present

## 2019-03-04 DIAGNOSIS — R809 Proteinuria, unspecified: Secondary | ICD-10-CM | POA: Diagnosis not present

## 2019-03-04 DIAGNOSIS — E11649 Type 2 diabetes mellitus with hypoglycemia without coma: Secondary | ICD-10-CM | POA: Diagnosis not present

## 2019-03-04 DIAGNOSIS — E10649 Type 1 diabetes mellitus with hypoglycemia without coma: Secondary | ICD-10-CM | POA: Diagnosis not present

## 2019-03-09 DIAGNOSIS — Z23 Encounter for immunization: Secondary | ICD-10-CM | POA: Diagnosis not present

## 2019-03-13 DIAGNOSIS — Z1231 Encounter for screening mammogram for malignant neoplasm of breast: Secondary | ICD-10-CM | POA: Diagnosis not present

## 2019-03-13 DIAGNOSIS — Z9289 Personal history of other medical treatment: Secondary | ICD-10-CM | POA: Diagnosis not present

## 2019-05-01 ENCOUNTER — Ambulatory Visit (INDEPENDENT_AMBULATORY_CARE_PROVIDER_SITE_OTHER): Payer: Medicare Other | Admitting: Ophthalmology

## 2019-05-01 ENCOUNTER — Encounter (INDEPENDENT_AMBULATORY_CARE_PROVIDER_SITE_OTHER): Payer: Self-pay | Admitting: Ophthalmology

## 2019-05-01 ENCOUNTER — Other Ambulatory Visit: Payer: Self-pay

## 2019-05-01 DIAGNOSIS — H43811 Vitreous degeneration, right eye: Secondary | ICD-10-CM

## 2019-05-01 DIAGNOSIS — H35043 Retinal micro-aneurysms, unspecified, bilateral: Secondary | ICD-10-CM | POA: Diagnosis not present

## 2019-05-01 DIAGNOSIS — E103591 Type 1 diabetes mellitus with proliferative diabetic retinopathy without macular edema, right eye: Secondary | ICD-10-CM | POA: Insufficient documentation

## 2019-05-01 DIAGNOSIS — E103553 Type 1 diabetes mellitus with stable proliferative diabetic retinopathy, bilateral: Secondary | ICD-10-CM

## 2019-05-01 NOTE — Patient Instructions (Signed)
Diabetic Retinopathy Diabetic retinopathy is a disease of the retina. The retina is a light-sensitive membrane at the back of the eye. Retinopathy is a complication of diabetes (diabetes mellitus) and a common cause of bad eyesight (visual impairment). It can eventually cause blindness. Early detection and treatment of diabetic retinopathy is important in keeping your eyes healthy and preventing further damage to them. What are the causes? Diabetic retinopathy is caused by blood sugar (glucose) levels that are too high for an extended period of time. High blood glucose over an extended period of time can:  Damage small blood vessels in the retina, allowing blood to leak through the vessel walls.  Cause new, abnormal blood vessels to grow on the retina. This can scar the retina in the advanced stage of diabetic retinopathy. What increases the risk? You are more likely to develop this condition if:  You have had diabetes for a long time.  You have poorly controlled blood glucose.  You have high blood pressure. What are the signs or symptoms? In the early stages of diabetic retinopathy, there are often no symptoms. As the condition gets worse, symptoms may include:  Blurred vision. This is usually caused by swelling due to abnormal blood glucose levels. The blurriness may go away when blood glucose levels return to normal.  Moving specks or dark spots (floaters) in your vision. These can be caused by a small amount of bleeding (hemorrhage) from retinal blood vessels.  Missing parts of your field of vision, such as vision at the sides of the eyes. This can be caused by larger retinal hemorrhages.  Difficulty reading.  Double vision.  Pain in one or both eyes.  Feeling pressure in one or both eyes.  Trouble seeing straight lines. Straight lines may not look straight.  Redness of the eyes that does not go away. How is this diagnosed? This condition may be diagnosed with an eye exam in  which your eye care specialist puts drops in your eyes that enlarge (dilate) your pupils. This lets your health care provider examine your retina and check for changes in your retinal blood vessels. How is this treated? This condition may be treated by:  Keeping your blood glucose and blood pressure within a target range.  Using a type of laser beam to seal your retinal blood vessels. This stops them from bleeding and decreases pressure in your eye.  Getting shots of medicine in the eye to reduce swelling of the center of the retina (macula). You may be given: ? Anti-VEGF medicine. This medicine can help slow vision loss, and may even improve vision. ? Steroid medicine. Follow these instructions at home:   Follow your diabetes management plan as directed by your health care provider. This may include exercising regularly and eating a healthy diet.  Keep your blood glucose level and your blood pressure in your target range, as directed by your health care provider.  Check your blood glucose as often as directed.  Take over the counter and prescription medicines only as told by your health care provider. This includes insulin and oral diabetes medicine.  Get your eyes checked at least once every year. An eye specialist can usually see diabetic retinopathy developing long before it starts to cause problems. In many cases, it can be treated to prevent complications from occurring.  Do not use any products that contain nicotine or tobacco, such as cigarettes and e-cigarettes. If you need help quitting, ask your health care provider.  Keep all follow-up   visits as told by your health care provider. This is important. Contact a health care provider if:  You notice gradual blurring or other changes in your vision over time.  You notice that your glasses or contact lenses do not make things look as sharp as they once did.  You have trouble reading or seeing details at a distance with either  eye.  You notice a change in your vision or notice that parts of your field of vision appear missing or hazy.  You suddenly see moving specks or dark spots in the field of vision of either eye. Get help right away if:  You have sudden pain or pressure in one or both eyes.  You suddenly lose vision or a curtain or veil seems to come across your eyes.  You have a sudden burst of floaters in your vision. Summary  Diabetic retinopathy is a disease of the retina. The retina is a light-sensitive membrane at the back of the eye. Retinopathy is a complication of diabetes.  Get your eyes checked at least once every year. An eye specialist can usually see diabetic retinopathy developing long before it starts to cause problems. In many cases, it can be treated to prevent complications from occurring.  Keep your blood glucose and your blood pressure in target range. Follow your diabetes management plan as directed by your health care provider.  Protect your eyes. Wear sunglasses and eye protection when needed. This information is not intended to replace advice given to you by your health care provider. Make sure you discuss any questions you have with your health care provider. Document Revised: 02/22/2017 Document Reviewed: 02/15/2016 Elsevier Patient Education  [employer].   The nature of regressed proliferative diabetic retinopathy was discussed with the patient. The patient was advised to maintain good glucose, blood pressure, monitor kidney function and serum lipid control as advised by personal physician. Complete avoidance of smoking was recommended. The chance of recurrent proliferative diabetic retinopathy was discussed as well as the chance of vitreous hemorrhage for which further treatments may be necessary.

## 2019-05-01 NOTE — Progress Notes (Signed)
05/01/2019     CHIEF COMPLAINT Patient presents for Diabetic Retinopathy without Macular Edema and Diabetic Eye Exam   HISTORY OF PRESENT ILLNESS: Gail Franco is a 64 y.o. female who presents to the clinic today for:   HPI    Diabetic Eye Exam    Vision is stable.  Diabetes characteristics include Type 1.  This started 11 months ago.  Blood sugar level is controlled.  Last Blood Glucose 103 (This AM).  Last A1C 7.2 (03/2019).          Comments    11 Month Diabetic Exam OU - OCT  Pt denies noticeable changes to [place] OU since last visit. Pt denies ocular pain, flashes of light, or floaters OU.         Last edited by Edmon Crape, MD on 05/01/2019 10:39 AM. (History)      Referring physician: Corky Downs, MD [address] [place],  [place] [address]  HISTORICAL INFORMATION:   Selected notes from the MEDICAL RECORD NUMBER    Lab Results  Component Value Date   HGBA1C 6.8 (H) 02/09/2017     CURRENT MEDICATIONS: No current outpatient medications on file. (Ophthalmic Drugs)   No current facility-administered medications for this visit. (Ophthalmic Drugs)   Current Outpatient Medications (Other)  Medication Sig  . albuterol (PROAIR HFA) 108 (90 Base) MCG/ACT inhaler INL 2 PFS PO VIA SPACER Q 4 H PRF COUGH AND WHZ  . Glucagon (BAQSIMI ONE PACK) 3 MG/DOSE POWD 3 mg by Left Nare route once as needed for up to 1 dose. For severe hypoglycemia with impaired consciousness  . insulin degludec (TRESIBA FLEXTOUCH) 100 UNIT/ML FlexTouch Pen Inject into the skin.  Marland Kitchen acetaminophen (TYLENOL) 500 MG tablet Take 500 mg by mouth every 6 (six) hours as needed for pain.  . Aspirin Buf,CaCarb-MgCarb-MgO, 81 MG TABS Take by mouth.  Marland Kitchen aspirin EC 81 MG tablet Take 81 mg by mouth daily.  . Biotin 10 MG TABS Take by mouth.  . Calcium Carb-Cholecalciferol 250-125 MG-UNIT TABS Take by mouth.  . carvedilol (COREG) 6.25 MG tablet Take 1 tablet by mouth 2 (two) times daily with a meal.   .  clopidogrel (PLAVIX) 75 MG tablet Take 75 mg by mouth daily.  . cyclobenzaprine (FLEXERIL) 10 MG tablet Take 10 mg by mouth at bedtime.   Marland Kitchen FLUoxetine (PROZAC) 20 MG capsule Take 20 mg by mouth 2 (two) times daily.   Marland Kitchen gabapentin (NEURONTIN) 100 MG capsule Take 100 mg by mouth 3 (three) times daily.  Marland Kitchen HUMALOG 100 UNIT/ML injection Inject into the skin 3 (three) times daily with meals. Per sliding scale  . LANTUS 100 UNIT/ML injection Inject 4-12 Units into the skin See admin instructions. 4 units in the morning, 8-12 units in the evening  . lisinopril (PRINIVIL,ZESTRIL) 2.5 MG tablet Take 2.5 mg by mouth at bedtime.   Marland Kitchen oxyCODONE-acetaminophen (PERCOCET) 7.5-325 MG tablet Take 1 tablet by mouth every 6 (six) hours as needed for severe pain.  . pantoprazole (PROTONIX) 40 MG tablet Take 40 mg by mouth every morning.   . rosuvastatin (CRESTOR) 10 MG tablet Take 10 mg by mouth at bedtime.   . topiramate (TOPAMAX) 50 MG tablet Take 50 mg by mouth 2 (two) times daily.  . vitamin B-12 (CYANOCOBALAMIN) 500 MCG tablet Take by mouth.   No current facility-administered medications for this visit. (Other)      REVIEW OF SYSTEMS:    ALLERGIES Allergies  Allergen Reactions  .  Other Itching    Steri strips     PAST MEDICAL HISTORY Past Medical History:  Diagnosis Date  . Arthritis   . Diabetes mellitus without complication (HCC)    Type 1  . Diffuse cystic mastopathy   . Myocardial infarction (HCC)   . PONV (postoperative nausea and vomiting)    always gets nauseated, needs Zofran  . Thyroid disease 2008   no longer on medications   Past Surgical History:  Procedure Laterality Date  . ABDOMINAL HYSTERECTOMY  1992  . APPENDECTOMY    . BREAST BIOPSY  1992  . CARDIAC CATHETERIZATION  2010  . CARPAL TUNNEL RELEASE    . COLONOSCOPY  2010  . CORONARY ARTERY BYPASS GRAFT  1995  . EYE SURGERY Left    cataract with lens implant  . EYE SURGERY Right 2018   cataract with lens implant    . lumbar fusions  2010  . LUMBAR WOUND DEBRIDEMENT N/A 02/09/2017   Procedure: LUMBAR WOUND REVISION WITH REMOVAL OF PALTE;  Surgeon: Tia Alert, MD;  Location: [facility] OR;  Service: Neurosurgery;  Laterality: N/A;  . OTHER SURGICAL HISTORY  2011   scar revision d/t fistula lumbar fusion  . SHOULDER SURGERY    . UPPER GI ENDOSCOPY  2010, 2011    FAMILY HISTORY Family History  Problem Relation Age of Onset  . Atrial fibrillation Mother   . Heart disease Father     SOCIAL HISTORY Social History   Tobacco Use  . Smoking status: Never Smoker  . Smokeless tobacco: Never Used  Substance Use Topics  . Alcohol use: No  . Drug use: No         OPHTHALMIC EXAM:  Base Eye Exam    Visual Acuity (Snellen - Linear)      Right Left   Dist [place] 20/25 -1 20/40 +2   Dist ph [place]  20/30 -2       Tonometry (Tonopen, 9:38 AM)      Right Left   Pressure 14 16       Pupils      Pupils Dark Light Shape React APD   Right PERRL 4 3 Round Brisk None   Left PERRL 4 3 Round Brisk None       Visual Fields (Counting fingers)      Left Right    Full Full       Extraocular Movement      Right Left    Full Full       Neuro/Psych    Oriented x3: Yes   Mood/Affect: Normal       Dilation    Both eyes: 1.0% Mydriacyl, 2.5% Phenylephrine @ 9:38 AM        Slit Lamp and Fundus Exam    External Exam      Right Left   External Normal Normal       Slit Lamp Exam      Right Left   Lids/Lashes Normal Normal   Conjunctiva/Sclera White and quiet White and quiet   Cornea Clear Clear   Anterior Chamber Deep and quiet Deep and quiet   Iris Round and reactive Round and reactive   Lens Posterior chamber intraocular lens Posterior chamber intraocular lens   Vitreous Posterior vitreous detachment Posterior vitreous detachment       Fundus Exam      Right Left   Disc Normal Normal   C/D Ratio 0.4 0.35   Macula Microaneurysms Microaneurysms   Vessels  To diabetic retinopathy OU quiesced  sent To diabetic retinopathy OU quiesced sent   Periphery Diabetic retinopathy, quiet, good PRP Diabetic retinopathy, quiet, good PRP          IMAGING AND PROCEDURES  Imaging and Procedures for 05/01/19  OCT, Retina - OU - Both Eyes       Right Eye Quality was good. Scan locations included subfoveal. Findings include normal observations, central retinal atrophy.   Left Eye Quality was good. Scan locations included subfoveal. Findings include normal observations, subretinal scarring.   Notes No active clinically significant macular edema OU.  Vitreal macular adhesion from previous examinations has resolved.                ASSESSMENT/PLAN:    ICD-10-CM   1. Controlled type 1 diabetes mellitus with stable proliferative retinopathy of both eyes (HCC)  E10.3553 OCT, Retina - OU - Both Eyes  2. Posterior vitreous detachment of right eye  H43.811   3. Retinal microaneurysm of both eyes  H35.043     1.  2.  3.  Ophthalmic Meds Ordered this visit:  No orders of the defined types were placed in this encounter.      Return in about 1 year (around 04/30/2020) for DILATE OU, COLOR FP.  Patient Instructions  Diabetic Retinopathy Diabetic retinopathy is a disease of the retina. The retina is a light-sensitive membrane at the back of the eye. Retinopathy is a complication of diabetes (diabetes mellitus) and a common cause of bad eyesight (visual impairment). It can eventually cause blindness. Early detection and treatment of diabetic retinopathy is important in keeping your eyes healthy and preventing further damage to them. What are the causes? Diabetic retinopathy is caused by blood sugar (glucose) levels that are too high for an extended period of time. High blood glucose over an extended period of time can:  Damage small blood vessels in the retina, allowing blood to leak through the vessel walls.  Cause new, abnormal blood vessels to grow on the retina. This can scar  the retina in the advanced stage of diabetic retinopathy. What increases the risk? You are more likely to develop this condition if:  You have had diabetes for a long time.  You have poorly controlled blood glucose.  You have high blood pressure. What are the signs or symptoms? In the early stages of diabetic retinopathy, there are often no symptoms. As the condition gets worse, symptoms may include:  Blurred vision. This is usually caused by swelling due to abnormal blood glucose levels. The blurriness may go away when blood glucose levels return to normal.  Moving specks or dark spots (floaters) in your vision. These can be caused by a small amount of bleeding (hemorrhage) from retinal blood vessels.  Missing parts of your field of vision, such as vision at the sides of the eyes. This can be caused by larger retinal hemorrhages.  Difficulty reading.  Double vision.  Pain in one or both eyes.  Feeling pressure in one or both eyes.  Trouble seeing straight lines. Straight lines may not look straight.  Redness of the eyes that does not go away. How is this diagnosed? This condition may be diagnosed with an eye exam in which your eye care specialist puts drops in your eyes that enlarge (dilate) your pupils. This lets your health care provider examine your retina and check for changes in your retinal blood vessels. How is this treated? This condition may be treated by:  Keeping your  blood glucose and blood pressure within a target range.  Using a type of laser beam to seal your retinal blood vessels. This stops them from bleeding and decreases pressure in your eye.  Getting shots of medicine in the eye to reduce swelling of the center of the retina (macula). You may be given: ? Anti-VEGF medicine. This medicine can help slow vision loss, and may even improve vision. ? Steroid medicine. Follow these instructions at home:   Follow your diabetes management plan as directed by  your health care provider. This may include exercising regularly and eating a healthy diet.  Keep your blood glucose level and your blood pressure in your target range, as directed by your health care provider.  Check your blood glucose as often as directed.  Take over the counter and prescription medicines only as told by your health care provider. This includes insulin and oral diabetes medicine.  Get your eyes checked at least once every year. An eye specialist can usually see diabetic retinopathy developing long before it starts to cause problems. In many cases, it can be treated to prevent complications from occurring.  Do not use any products that contain nicotine or tobacco, such as cigarettes and e-cigarettes. If you need help quitting, ask your health care provider.  Keep all follow-up visits as told by your health care provider. This is important. Contact a health care provider if:  You notice gradual blurring or other changes in your vision over time.  You notice that your glasses or contact lenses do not make things look as sharp as they once did.  You have trouble reading or seeing details at a distance with either eye.  You notice a change in your vision or notice that parts of your field of vision appear missing or hazy.  You suddenly see moving specks or dark spots in the field of vision of either eye. Get help right away if:  You have sudden pain or pressure in one or both eyes.  You suddenly lose vision or a curtain or veil seems to come across your eyes.  You have a sudden burst of floaters in your vision. Summary  Diabetic retinopathy is a disease of the retina. The retina is a light-sensitive membrane at the back of the eye. Retinopathy is a complication of diabetes.  Get your eyes checked at least once every year. An eye specialist can usually see diabetic retinopathy developing long before it starts to cause problems. In many cases, it can be treated to  prevent complications from occurring.  Keep your blood glucose and your blood pressure in target range. Follow your diabetes management plan as directed by your health care provider.  Protect your eyes. Wear sunglasses and eye protection when needed. This information is not intended to replace advice given to you by your health care provider. Make sure you discuss any questions you have with your health care provider. Document Revised: 02/22/2017 Document Reviewed: 02/15/2016 Elsevier Patient Education  [employer].   The nature of regressed proliferative diabetic retinopathy was discussed with the patient. The patient was advised to maintain good glucose, blood pressure, monitor kidney function and serum lipid control as advised by personal physician. Complete avoidance of smoking was recommended. The chance of recurrent proliferative diabetic retinopathy was discussed as well as the chance of vitreous hemorrhage for which further treatments may be necessary.    Explained the diagnoses, plan, and follow up with the patient and they expressed understanding.  Patient expressed  understanding of the importance of proper follow up care.   Alford HighlandGary A. Lancer Thurner M.D. Diseases & Surgery of the Retina and Vitreous [facility] 05/01/19     Abbreviations: M myopia (nearsighted); A astigmatism; H hyperopia (farsighted); P presbyopia; Mrx spectacle prescription;  CTL contact lenses; OD right eye; OS left eye; OU both eyes  XT exotropia; ET esotropia; PEK punctate epithelial keratitis; PEE punctate epithelial erosions; DES dry eye syndrome; MGD meibomian gland dysfunction; ATs artificial tears; PFAT's preservative free artificial tears; NSC nuclear sclerotic cataract; PSC posterior subcapsular cataract; ERM epi-retinal membrane; PVD posterior vitreous detachment; RD retinal detachment; DM diabetes mellitus; DR diabetic retinopathy; NPDR non-proliferative diabetic retinopathy; PDR  proliferative diabetic retinopathy; CSME clinically significant macular edema; DME diabetic macular edema; dbh dot blot hemorrhages; CWS cotton wool spot; POAG primary open angle glaucoma; C/D cup-to-disc ratio; HVF humphrey visual field; GVF goldmann visual field; OCT optical coherence tomography; IOP intraocular pressure; BRVO Branch retinal vein occlusion; CRVO central retinal vein occlusion; CRAO central retinal artery occlusion; BRAO branch retinal artery occlusion; RT retinal tear; SB scleral buckle; PPV pars plana vitrectomy; VH Vitreous hemorrhage; PRP panretinal laser photocoagulation; IVK intravitreal kenalog; VMT vitreomacular traction; MH Macular hole;  NVD neovascularization of the disc; NVE neovascularization elsewhere; AREDS age related eye disease study; ARMD age related macular degeneration; POAG primary open angle glaucoma; EBMD epithelial/anterior basement membrane dystrophy; ACIOL anterior chamber intraocular lens; IOL intraocular lens; PCIOL posterior chamber intraocular lens; Phaco/IOL phacoemulsification with intraocular lens placement; PRK photorefractive keratectomy; LASIK laser assisted in situ keratomileusis; HTN hypertension; DM diabetes mellitus; COPD chronic obstructive pulmonary disease

## 2019-05-01 NOTE — Assessment & Plan Note (Signed)
The nature of regressed proliferative diabetic retinopathy was discussed with the patient. The patient was advised to maintain good glucose, blood pressure, monitor kidney function and serum lipid control as advised by personal physician. Complete avoidance of smoking was recommended. The chance of recurrent proliferative diabetic retinopathy was discussed as well as the chance of vitreous hemorrhage for which further treatments may be necessary.The nature of regressed diabetic macular edema was discussed with the patient as well as the possibility of recurrence. The importance of the tight glucose, blood pressure, and serum lipid control were emphasized. The importance of compliance with follow up was emphasized. The patient was advised to avoid smoking.  Currently the swelling in macula is controlled, and improved from prior visits.

## 2019-06-12 ENCOUNTER — Encounter: Payer: Self-pay | Admitting: Internal Medicine

## 2019-06-12 ENCOUNTER — Ambulatory Visit (INDEPENDENT_AMBULATORY_CARE_PROVIDER_SITE_OTHER): Payer: Medicare Other | Admitting: Internal Medicine

## 2019-06-12 ENCOUNTER — Other Ambulatory Visit: Payer: Self-pay

## 2019-06-12 DIAGNOSIS — E103553 Type 1 diabetes mellitus with stable proliferative diabetic retinopathy, bilateral: Secondary | ICD-10-CM | POA: Diagnosis not present

## 2019-06-12 DIAGNOSIS — H43811 Vitreous degeneration, right eye: Secondary | ICD-10-CM | POA: Diagnosis not present

## 2019-06-12 DIAGNOSIS — J301 Allergic rhinitis due to pollen: Secondary | ICD-10-CM

## 2019-06-12 DIAGNOSIS — N6019 Diffuse cystic mastopathy of unspecified breast: Secondary | ICD-10-CM | POA: Diagnosis not present

## 2019-06-12 DIAGNOSIS — H35043 Retinal micro-aneurysms, unspecified, bilateral: Secondary | ICD-10-CM

## 2019-06-12 MED ORDER — TOPIRAMATE 50 MG PO TABS: 50.0000 mg | ORAL_TABLET | Freq: Two times a day (BID) | ORAL | 3 refills | Status: DC

## 2019-06-12 MED ORDER — LISINOPRIL 2.5 MG PO TABS: 2.5000 mg | ORAL_TABLET | Freq: Every day | ORAL | 3 refills | Status: DC

## 2019-06-12 NOTE — Assessment & Plan Note (Signed)
Seen eye  Doc recently

## 2019-06-12 NOTE — Assessment & Plan Note (Signed)
Diabetes  Under  controll

## 2019-06-12 NOTE — Assessment & Plan Note (Signed)
stable °

## 2019-06-12 NOTE — Progress Notes (Signed)
Established Patient Office Visit  Subjective:  Patient ID: Gail Franco, female    DOB: 1955/04/30  Age: 64 y.o. MRN: [id number]  CC:  Chief Complaint  Patient presents with  . Diabetes    patient checked blood sugar from home and it was 98    HPI  Gail Franco presents for general checkup.  Blood sugar is under control she also sees an eye doctor in [place] and she did not have recent changes.  Patient is known to have coronary artery disease.  She denies any angina chest pain on exertion or paroxysmal nocturnal dyspnea she has a heart murmur which is stable over the years.  She has some trouble getting up from the chair due to proximal muscle weakness and I told her the exercises for that Past Medical History:  Diagnosis Date  . Arthritis   . Diabetes mellitus without complication (HCC)    Type 1  . Diffuse cystic mastopathy   . Myocardial infarction (HCC)   . PONV (postoperative nausea and vomiting)    always gets nauseated, needs Zofran  . Thyroid disease 2008   no longer on medications    Past Surgical History:  Procedure Laterality Date  . ABDOMINAL HYSTERECTOMY  1992  . APPENDECTOMY    . BREAST BIOPSY  1992  . CARDIAC CATHETERIZATION  2010  . CARPAL TUNNEL RELEASE    . COLONOSCOPY  2010  . CORONARY ARTERY BYPASS GRAFT  1995  . EYE SURGERY Left    cataract with lens implant  . EYE SURGERY Right 2018   cataract with lens implant  . lumbar fusions  2010  . LUMBAR WOUND DEBRIDEMENT N/A 02/09/2017   Procedure: LUMBAR WOUND REVISION WITH REMOVAL OF PALTE;  Surgeon: Tia Alert, MD;  Location: [facility] OR;  Service: Neurosurgery;  Laterality: N/A;  . OTHER SURGICAL HISTORY  2011   scar revision d/t fistula lumbar fusion  . SHOULDER SURGERY    . UPPER GI ENDOSCOPY  2010, 2011    Family History  Problem Relation Age of Onset  . Atrial fibrillation Mother   . Heart disease Father     Social History   Socioeconomic History  . Marital status: Married   Spouse name: Not on file  . Number of children: Not on file  . Years of education: Not on file  . Highest education level: Not on file  Occupational History  . Not on file  Tobacco Use  . Smoking status: Never Smoker  . Smokeless tobacco: Never Used  Substance and Sexual Activity  . Alcohol use: No  . Drug use: No  . Sexual activity: Not on file  Other Topics Concern  . Not on file  Social History Narrative  . Not on file   Social Determinants of Health   Financial Resource Strain:   . Difficulty of Paying Living Expenses:   Food Insecurity:   . Worried About Programme researcher, broadcasting/film/video in the Last Year:   . Barista in the Last Year:   Transportation Needs:   . Freight forwarder (Medical):   Marland Kitchen Lack of Transportation (Non-Medical):   Physical Activity:   . Days of Exercise per Week:   . Minutes of Exercise per Session:   Stress:   . Feeling of Stress :   Social Connections:   . Frequency of Communication with Friends and Family:   . Frequency of Social Gatherings with Friends and Family:   . Attends Religious  Services:   . Active Member of Clubs or Organizations:   . Attends Banker Meetings:   Marland Kitchen Marital Status:   Intimate Partner Violence:   . Fear of Current or Ex-Partner:   . Emotionally Abused:   Marland Kitchen Physically Abused:   . Sexually Abused:      Current Outpatient Medications:  .  acetaminophen (TYLENOL) 500 MG tablet, Take 500 mg by mouth every 6 (six) hours as needed for pain., Disp: , Rfl:  .  carvedilol (COREG) 6.25 MG tablet, Take 1 tablet by mouth 2 (two) times daily with a meal. , Disp: , Rfl:  .  clopidogrel (PLAVIX) 75 MG tablet, Take 75 mg by mouth daily., Disp: , Rfl:  .  cyclobenzaprine (FLEXERIL) 10 MG tablet, Take 10 mg by mouth at bedtime. , Disp: , Rfl:  .  FLUoxetine (PROZAC) 20 MG capsule, Take 20 mg by mouth 2 (two) times daily. , Disp: , Rfl:  .  gabapentin (NEURONTIN) 100 MG capsule, Take 100 mg by mouth 3 (three) times  daily., Disp: , Rfl:  .  Glucagon (BAQSIMI ONE PACK) 3 MG/DOSE POWD, 3 mg by Left Nare route once as needed for up to 1 dose. For severe hypoglycemia with impaired consciousness, Disp: , Rfl:  .  HUMALOG 100 UNIT/ML injection, Inject into the skin 3 (three) times daily with meals. Per sliding scale, Disp: , Rfl:  .  insulin degludec (TRESIBA FLEXTOUCH) 100 UNIT/ML FlexTouch Pen, Inject into the skin., Disp: , Rfl:  .  LANTUS 100 UNIT/ML injection, Inject 4-12 Units into the skin See admin instructions. 4 units in the morning, 8-12 units in the evening, Disp: , Rfl:  .  lisinopril (ZESTRIL) 2.5 MG tablet, Take 1 tablet (2.5 mg total) by mouth at bedtime., Disp: 90 tablet, Rfl: 3 .  oxyCODONE-acetaminophen (PERCOCET) 7.5-325 MG tablet, Take 1 tablet by mouth every 6 (six) hours as needed for severe pain., Disp: 30 tablet, Rfl: 0 .  pantoprazole (PROTONIX) 40 MG tablet, Take 40 mg by mouth every morning. , Disp: , Rfl:  .  rosuvastatin (CRESTOR) 10 MG tablet, Take 10 mg by mouth at bedtime. , Disp: , Rfl:  .  topiramate (TOPAMAX) 50 MG tablet, Take 1 tablet (50 mg total) by mouth 2 (two) times daily., Disp: 180 tablet, Rfl: 3 .  Aspirin Buf,CaCarb-MgCarb-MgO, 81 MG TABS, Take by mouth., Disp: , Rfl:    Allergies  Allergen Reactions  . Other Itching    Steri strips     ROS Review of Systems  Constitutional: Negative.   HENT: Negative.   Eyes: Negative.   Respiratory: Negative.   Cardiovascular: Negative.   Gastrointestinal: Negative.   Endocrine: Negative.   Genitourinary: Negative.   Musculoskeletal: Positive for arthralgias.  Neurological: Negative for dizziness and speech difficulty.  Hematological: Negative for adenopathy.  Psychiatric/Behavioral: Negative for decreased concentration.      Objective:    Physical Exam  Constitutional: She appears well-developed.  HENT:  Head: Normocephalic and atraumatic.  Eyes: Pupils are equal, round, and reactive to light. Conjunctivae and  EOM are normal.  Neck: No JVD present. No tracheal deviation present. No thyromegaly present.  Cardiovascular: Normal rate and regular rhythm.  Murmur heard. Pulmonary/Chest: No respiratory distress. She has no wheezes. She has no rales.  Abdominal: She exhibits mass. There is no abdominal tenderness.  Musculoskeletal:        General: No edema.     Cervical back: Neck supple.     Comments:  No bruit was not noted.  Lymphadenopathy:    She has no cervical adenopathy.  Skin: Skin is warm.    There were no vitals taken for this visit. Wt Readings from Last 3 Encounters:  02/09/17 150 lb (68 kg)  11/02/16 154 lb (69.9 kg)  08/31/16 152 lb 9.6 oz (69.2 kg)     Health Maintenance Due  Topic Date Due  . Hepatitis C Screening  Never done  . FOOT EXAM  Never done  . OPHTHALMOLOGY EXAM  Never done  . HIV Screening  Never done  . PAP SMEAR-Modifier  Never done  . COLONOSCOPY  Never done  . HEMOGLOBIN A1C  08/09/2017  . MAMMOGRAM  09/01/2018  . DEXA SCAN  Never done  . PNA vac Low Risk Adult (1 of 2 - PCV13) 10/27/2018    There are no preventive care reminders to display for this patient.  No results found for: TSH Lab Results  Component Value Date   WBC 6.9 02/09/2017   HGB 13.1 02/09/2017   HCT 40.6 02/09/2017   MCV 96.7 02/09/2017   PLT 272 02/09/2017   Lab Results  Component Value Date   NA 139 02/09/2017   K 4.9 02/09/2017   CO2 18 (L) 02/09/2017   GLUCOSE 118 (H) 02/09/2017   BUN 25 (H) 02/09/2017   CREATININE 1.03 (H) 02/09/2017   CALCIUM 9.9 02/09/2017   ANIONGAP 13 02/09/2017   No results found for: CHOL No results found for: HDL No results found for: LDLCALC No results found for: TRIG No results found for: CHOLHDL Lab Results  Component Value Date   HGBA1C 6.8 (H) 02/09/2017      Assessment & Plan:   Problem List Items Addressed This Visit      Cardiovascular and Mediastinum   Retinal microaneurysm of both eyes    stable      Relevant  Medications   lisinopril (ZESTRIL) 2.5 MG tablet     Respiratory   Seasonal allergic rhinitis due to pollen    stable        Endocrine   Controlled type 1 diabetes mellitus with stable proliferative retinopathy of both eyes (HCC) - Primary    Diabetes  Under  controll      Relevant Medications   lisinopril (ZESTRIL) 2.5 MG tablet     Other   Diffuse cystic mastopathy   Posterior vitreous detachment of right eye    Seen eye  Doc recently         Meds ordered this encounter  Medications  . topiramate (TOPAMAX) 50 MG tablet    Sig: Take 1 tablet (50 mg total) by mouth 2 (two) times daily.    Dispense:  180 tablet    Refill:  3  . lisinopril (ZESTRIL) 2.5 MG tablet    Sig: Take 1 tablet (2.5 mg total) by mouth at bedtime.    Dispense:  90 tablet    Refill:  3    Follow-up: Return in about 3 months (around 09/12/2019).    Cletis Athens, MD

## 2019-09-04 DIAGNOSIS — N182 Chronic kidney disease, stage 2 (mild): Secondary | ICD-10-CM | POA: Diagnosis not present

## 2019-09-04 DIAGNOSIS — I25810 Atherosclerosis of coronary artery bypass graft(s) without angina pectoris: Secondary | ICD-10-CM | POA: Diagnosis not present

## 2019-09-04 DIAGNOSIS — E10649 Type 1 diabetes mellitus with hypoglycemia without coma: Secondary | ICD-10-CM | POA: Diagnosis not present

## 2019-09-04 DIAGNOSIS — E103593 Type 1 diabetes mellitus with proliferative diabetic retinopathy without macular edema, bilateral: Secondary | ICD-10-CM | POA: Diagnosis not present

## 2019-09-11 ENCOUNTER — Other Ambulatory Visit: Payer: Self-pay

## 2019-09-11 ENCOUNTER — Ambulatory Visit (INDEPENDENT_AMBULATORY_CARE_PROVIDER_SITE_OTHER): Payer: Medicare Other | Admitting: Internal Medicine

## 2019-09-11 ENCOUNTER — Encounter: Payer: Self-pay | Admitting: Internal Medicine

## 2019-09-11 VITALS — BP 115/67 | HR 76 | Temp 96.9°F | Ht 61.5 in | Wt 139.0 lb

## 2019-09-11 DIAGNOSIS — J301 Allergic rhinitis due to pollen: Secondary | ICD-10-CM | POA: Diagnosis not present

## 2019-09-11 DIAGNOSIS — E103553 Type 1 diabetes mellitus with stable proliferative diabetic retinopathy, bilateral: Secondary | ICD-10-CM

## 2019-09-11 DIAGNOSIS — M79644 Pain in right finger(s): Secondary | ICD-10-CM | POA: Diagnosis not present

## 2019-09-11 DIAGNOSIS — N6019 Diffuse cystic mastopathy of unspecified breast: Secondary | ICD-10-CM | POA: Diagnosis not present

## 2019-09-11 DIAGNOSIS — H35043 Retinal micro-aneurysms, unspecified, bilateral: Secondary | ICD-10-CM

## 2019-09-11 DIAGNOSIS — M19041 Primary osteoarthritis, right hand: Secondary | ICD-10-CM

## 2019-09-11 NOTE — Progress Notes (Signed)
Established Patient Office Visit  SUBJECTIVE:  Subjective  Patient ID: Gail Franco, female    DOB: 10/30/1955  Age: 64 y.o. MRN: [id number]  CC:  Chief Complaint  Patient presents with   Follow-up    HPI Gail Franco is a 64 y.o. female presenting today for a follow up for her T1DM.   She will be updating her Freestyle Libre from style 1 to style 2 so that if her blood sugar gets too low, it will alert her if her blood sugar goes under 50. Her blood sugar today was 92.   She said her eyes are doing ok. Her shoulders are ok, but her left shoulder bothers her more. She notes that her right 5th digit has been swollen and she thinks that she has trigger finger.   Past Medical History:  Diagnosis Date   Arthritis    Diabetes mellitus without complication (HCC)    Type 1   Diffuse cystic mastopathy    Myocardial infarction (HCC)    PONV (postoperative nausea and vomiting)    always gets nauseated, needs Zofran   Thyroid disease 2008   no longer on medications    Past Surgical History:  Procedure Laterality Date   ABDOMINAL HYSTERECTOMY  1992   APPENDECTOMY     BREAST BIOPSY  1992   CARDIAC CATHETERIZATION  2010   CARPAL TUNNEL RELEASE     COLONOSCOPY  2010   CORONARY ARTERY BYPASS GRAFT  1995   EYE SURGERY Left    cataract with lens implant   EYE SURGERY Right 2018   cataract with lens implant   lumbar fusions  2010   LUMBAR WOUND DEBRIDEMENT N/A 02/09/2017   Procedure: LUMBAR WOUND REVISION WITH REMOVAL OF PALTE;  Surgeon: Tia AlertJones, David S, MD;  Location: [facility] OR;  Service: Neurosurgery;  Laterality: N/A;   OTHER SURGICAL HISTORY  2011   scar revision d/t fistula lumbar fusion   SHOULDER SURGERY     UPPER GI ENDOSCOPY  2010, 2011    Family History  Problem Relation Age of Onset   Atrial fibrillation Mother    Heart disease Father     Social History   Socioeconomic History   Marital status: Married    Spouse name: Not on file    Number of children: Not on file   Years of education: Not on file   Highest education level: Not on file  Occupational History   Not on file  Tobacco Use   Smoking status: Never Smoker   Smokeless tobacco: Never Used  Vaping Use   Vaping Use: Never used  Substance and Sexual Activity   Alcohol use: No   Drug use: No   Sexual activity: Not on file  Other Topics Concern   Not on file  Social History Narrative   Not on file   Social Determinants of Health   Financial Resource Strain:    Difficulty of Paying Living Expenses:   Food Insecurity:    Worried About Programme researcher, broadcasting/film/videounning Out of Food in the Last Year:    Baristaan Out of Food in the Last Year:   Transportation Needs:    Freight forwarderLack of Transportation (Medical):    Lack of Transportation (Non-Medical):   Physical Activity:    Days of Exercise per Week:    Minutes of Exercise per Session:   Stress:    Feeling of Stress :   Social Connections:    Frequency of Communication with Friends and Family:  Frequency of Social Gatherings with Friends and Family:    Attends Religious Services:    Active Member of Clubs or Organizations:    Attends Engineer, structural:    Marital Status:   Intimate Partner Violence:    Fear of Current or Ex-Partner:    Emotionally Abused:    Physically Abused:    Sexually Abused:      Current Outpatient Medications:    acetaminophen (TYLENOL) 500 MG tablet, Take 500 mg by mouth every 6 (six) hours as needed for pain., Disp: , Rfl:    Aspirin Buf,CaCarb-MgCarb-MgO, 81 MG TABS, Take by mouth., Disp: , Rfl:    carvedilol (COREG) 6.25 MG tablet, Take 1 tablet by mouth 2 (two) times daily with a meal. , Disp: , Rfl:    clopidogrel (PLAVIX) 75 MG tablet, Take 75 mg by mouth daily., Disp: , Rfl:    cyclobenzaprine (FLEXERIL) 10 MG tablet, Take 10 mg by mouth at bedtime. , Disp: , Rfl:    FLUoxetine (PROZAC) 20 MG capsule, Take 20 mg by mouth 2 (two) times daily. , Disp: ,  Rfl:    gabapentin (NEURONTIN) 100 MG capsule, Take 100 mg by mouth 3 (three) times daily., Disp: , Rfl:    Glucagon (BAQSIMI ONE PACK) 3 MG/DOSE POWD, 3 mg by Left Nare route once as needed for up to 1 dose. For severe hypoglycemia with impaired consciousness, Disp: , Rfl:    HUMALOG 100 UNIT/ML injection, Inject into the skin 3 (three) times daily with meals. Per sliding scale, Disp: , Rfl:    insulin degludec (TRESIBA FLEXTOUCH) 100 UNIT/ML FlexTouch Pen, Inject into the skin., Disp: , Rfl:    LANTUS 100 UNIT/ML injection, Inject 4-12 Units into the skin See admin instructions. 4 units in the morning, 8-12 units in the evening, Disp: , Rfl:    lisinopril (ZESTRIL) 2.5 MG tablet, Take 1 tablet (2.5 mg total) by mouth at bedtime., Disp: 90 tablet, Rfl: 3   oxyCODONE-acetaminophen (PERCOCET) 7.5-325 MG tablet, Take 1 tablet by mouth every 6 (six) hours as needed for severe pain., Disp: 30 tablet, Rfl: 0   pantoprazole (PROTONIX) 40 MG tablet, Take 40 mg by mouth every morning. , Disp: , Rfl:    rosuvastatin (CRESTOR) 10 MG tablet, Take 10 mg by mouth at bedtime. , Disp: , Rfl:    topiramate (TOPAMAX) 50 MG tablet, Take 1 tablet (50 mg total) by mouth 2 (two) times daily., Disp: 180 tablet, Rfl: 3   Allergies  Allergen Reactions   Other Itching    Steri strips     ROS Review of Systems  Constitutional: Negative.   HENT: Negative.   Eyes: Negative.  Negative for pain, discharge and visual disturbance.  Respiratory: Negative.  Negative for shortness of breath.   Cardiovascular: Negative.  Negative for chest pain and palpitations.  Gastrointestinal: Negative.   Endocrine: Negative.   Genitourinary: Negative.   Musculoskeletal: Positive for arthralgias (left shoulder) and myalgias.  Skin: Negative.   Allergic/Immunologic: Positive for environmental allergies.  Neurological: Negative.   Hematological: Negative.   Psychiatric/Behavioral: Negative.   All other systems reviewed  and are negative.    OBJECTIVE:    Physical Exam Vitals reviewed.  Constitutional:      Appearance: Normal appearance.  HENT:     Mouth/Throat:     Mouth: Mucous membranes are moist.  Eyes:     Pupils: Pupils are equal, round, and reactive to light.  Neck:     Vascular: No  carotid bruit.  Cardiovascular:     Rate and Rhythm: Normal rate and regular rhythm.     Pulses: Normal pulses.     Heart sounds: Normal heart sounds.  Pulmonary:     Effort: Pulmonary effort is normal.     Breath sounds: Normal breath sounds.  Abdominal:     General: Bowel sounds are normal.     Palpations: Abdomen is soft. There is no hepatomegaly, splenomegaly or mass.     Tenderness: There is no abdominal tenderness.     Hernia: No hernia is present.  Musculoskeletal:        General: Swelling present. No tenderness.     Cervical back: Neck supple.     Right lower leg: No edema.     Left lower leg: No edema.     Comments:  arthritis of the right middle finger in the middle interphalangeal joint  Skin:    Findings: No rash.  Neurological:     Mental Status: She is alert and oriented to person, place, and time.     Motor: No weakness.  Psychiatric:        Mood and Affect: Mood and affect normal.        Behavior: Behavior normal.     BP 115/67    Pulse 76    Temp (!) 96.9 F (36.1 C)    Ht 5' 1.5" (1.562 m)    Wt 139 lb (63 kg)    BMI 25.84 kg/m  Wt Readings from Last 3 Encounters:  09/11/19 139 lb (63 kg)  02/09/17 150 lb (68 kg)  11/02/16 154 lb (69.9 kg)    Health Maintenance Due  Topic Date Due   Hepatitis C Screening  Never done   FOOT EXAM  Never done   HIV Screening  Never done   PAP SMEAR-Modifier  Never done   COLONOSCOPY  Never done   HEMOGLOBIN A1C  08/09/2017   MAMMOGRAM  09/01/2018   DEXA SCAN  Never done   PNA vac Low Risk Adult (1 of 2 - PCV13) 10/27/2018   INFLUENZA VACCINE  08/25/2019    There are no preventive care reminders to display for this  patient.  CBC Latest Ref Rng & Units 02/09/2017 01/27/2009 03/14/2008  WBC 4.0 - 10.5 K/uL 6.9 6.6 6.4  Hemoglobin 12.0 - 15.0 g/dL 46.9 62.9 11.1(L)  Hematocrit 36 - 46 % 40.6 36.5 32.5(L)  Platelets 150 - 400 K/uL 272 213 284   CMP Latest Ref Rng & Units 02/09/2017 01/31/2017 01/27/2009  Glucose 65 - 99 mg/dL 528(U) - 132(G)  BUN 6 - 20 mg/dL 40(N) - 02(V)  Creatinine 0.44 - 1.00 mg/dL 2.53(G) 6.44(I) 3.47  Sodium 135 - 145 mmol/L 139 - 136  Potassium 3.5 - 5.1 mmol/L 4.9 - 5.4(H)  Chloride 101 - 111 mmol/L 108 - 103  CO2 22 - 32 mmol/L 18(L) - 27  Calcium 8.9 - 10.3 mg/dL 9.9 - 9.8    No results found for: TSH Lab Results  Component Value Date   ANIONGAP 13 02/09/2017   No results found for: CHOL, HDL, LDLCALC, CHOLHDL No results found for: TRIG Lab Results  Component Value Date   HGBA1C 6.8 (H) 02/09/2017      ASSESSMENT & PLAN:   Problem List Items Addressed This Visit      Cardiovascular and Mediastinum   Retinal microaneurysm of both eyes    Patient does not have any complaint right normal.  She is followed up by  a retina specialist in [place].        Respiratory   Seasonal allergic rhinitis due to pollen    She has problem with runny nose all the time and she takes Claritin on a daily basis as needed        Endocrine   Controlled type 1 diabetes mellitus with stable proliferative retinopathy of both eyes (HCC) - Primary    Patient blood sugar is under control.  She diabetic specialist in [place].  Last hemoglobin AIC was 7.2      Relevant Orders   CMP12+LP+7AC+CBC/Plt+Hb A1c     Musculoskeletal and Integument   Arthritis of finger of right hand    Patient has a swelling of the right little finger in the middle interphalangeal joint.  She is being referred to the orthopedics        Other   Diffuse cystic mastopathy    Patient get  Mammogram  on regular basis.           No orders of the defined types were placed in this  encounter.     Follow-up: No follow-ups on file.    Dr. Woodroe [facility] [address], [place], [place] [address]   By signing my name below, I, [employer], attest that this documentation has been prepared under the direction and in the presence of Corky Downs, MD. Electronically Signed: Corky Downs, MD 09/11/19, 1:48 PM   I personally performed the services described in this documentation, which was SCRIBED in my presence. The recorded information has been reviewed and considered accurate. It has been edited as necessary during review. Corky Downs, MD

## 2019-09-11 NOTE — Assessment & Plan Note (Signed)
Patient blood sugar is under control.  She diabetic specialist in [place].  Last hemoglobin AIC was 7.2

## 2019-09-11 NOTE — Assessment & Plan Note (Signed)
She has problem with runny nose all the time and she takes Claritin on a daily basis as needed

## 2019-09-11 NOTE — Assessment & Plan Note (Signed)
Patient does not have any complaint right normal.  She is followed up by a retina specialist in [place].

## 2019-09-11 NOTE — Assessment & Plan Note (Signed)
Patient get  Mammogram  on regular basis.

## 2019-09-11 NOTE — Progress Notes (Signed)
Gail Franco is a 64 y.o. female here for a 3 mo f/u. Pt is not having any new concerns.

## 2019-09-11 NOTE — Assessment & Plan Note (Signed)
Patient has a swelling of the right little finger in the middle interphalangeal joint.  She is being referred to the orthopedics

## 2019-10-29 ENCOUNTER — Other Ambulatory Visit: Payer: Self-pay | Admitting: Internal Medicine

## 2019-10-30 ENCOUNTER — Other Ambulatory Visit: Payer: Self-pay | Admitting: *Deleted

## 2019-10-30 MED ORDER — CARVEDILOL 6.25 MG PO TABS: 6.2500 mg | ORAL_TABLET | Freq: Two times a day (BID) | ORAL | Status: DC

## 2019-11-18 ENCOUNTER — Other Ambulatory Visit: Payer: Self-pay | Admitting: *Deleted

## 2019-11-18 MED ORDER — CARVEDILOL 6.25 MG PO TABS: 6.2500 mg | ORAL_TABLET | Freq: Two times a day (BID) | ORAL | Status: DC

## 2019-11-26 ENCOUNTER — Other Ambulatory Visit: Payer: Self-pay | Admitting: *Deleted

## 2019-11-26 MED ORDER — CARVEDILOL 6.25 MG PO TABS: 6.2500 mg | ORAL_TABLET | Freq: Two times a day (BID) | ORAL | Status: DC

## 2019-12-11 ENCOUNTER — Ambulatory Visit (INDEPENDENT_AMBULATORY_CARE_PROVIDER_SITE_OTHER): Payer: Medicare Other | Admitting: Internal Medicine

## 2019-12-11 ENCOUNTER — Encounter: Payer: Self-pay | Admitting: Internal Medicine

## 2019-12-11 ENCOUNTER — Other Ambulatory Visit: Payer: Self-pay

## 2019-12-11 VITALS — BP 132/84 | HR 78 | Ht 64.0 in | Wt 142.3 lb

## 2019-12-11 DIAGNOSIS — Z23 Encounter for immunization: Secondary | ICD-10-CM

## 2019-12-11 DIAGNOSIS — E103553 Type 1 diabetes mellitus with stable proliferative diabetic retinopathy, bilateral: Secondary | ICD-10-CM

## 2019-12-11 DIAGNOSIS — Z0000 Encounter for general adult medical examination without abnormal findings: Secondary | ICD-10-CM | POA: Diagnosis not present

## 2019-12-11 DIAGNOSIS — I10 Essential (primary) hypertension: Secondary | ICD-10-CM

## 2019-12-11 DIAGNOSIS — J301 Allergic rhinitis due to pollen: Secondary | ICD-10-CM | POA: Diagnosis not present

## 2019-12-11 MED ORDER — CYCLOBENZAPRINE HCL 10 MG PO TABS: 10.0000 mg | ORAL_TABLET | Freq: Every day | ORAL | 3 refills | Status: DC

## 2019-12-11 MED ORDER — ROSUVASTATIN CALCIUM 10 MG PO TABS: 10.0000 mg | ORAL_TABLET | Freq: Every day | ORAL | 3 refills | Status: DC

## 2019-12-11 NOTE — Assessment & Plan Note (Signed)
Patient denies any chest pain or shortness of breath.  Is keeping her weight down.  Her blood sugar is under control.  HEENT examination is normal neck is supple.  There is no goiter.  Cardiovascular system examination revealed grade 2 x 6 systolic murmur along the left sternal border.  Chest is clear abdominal soft nontender she is status post hysterectomy.  There is no pedal edema or calf tenderness.

## 2019-12-11 NOTE — Assessment & Plan Note (Signed)
Patient blood sugar is under control on Tresiba

## 2019-12-11 NOTE — Assessment & Plan Note (Signed)
Patient was advised to take Claritin 5 mg as needed 

## 2019-12-11 NOTE — Assessment & Plan Note (Signed)
Patient was vaccinated against flu 

## 2019-12-11 NOTE — Progress Notes (Signed)
Established Patient Office Visit  Subjective:  Patient ID: Gail Franco, female    DOB: 02-06-1955  Age: 64 y.o. MRN: [id number]  CC:  Chief Complaint  Patient presents with  . Annual Exam    HPI  Gail Franco presents for diabetes  Past Medical History:  Diagnosis Date  . Arthritis   . Diabetes mellitus without complication (HCC)    Type 1  . Diffuse cystic mastopathy   . Myocardial infarction (HCC)   . PONV (postoperative nausea and vomiting)    always gets nauseated, needs Zofran  . Thyroid disease 2008   no longer on medications    Past Surgical History:  Procedure Laterality Date  . ABDOMINAL HYSTERECTOMY  1992  . APPENDECTOMY    . BREAST BIOPSY  1992  . CARDIAC CATHETERIZATION  2010  . CARPAL TUNNEL RELEASE    . COLONOSCOPY  2010  . CORONARY ARTERY BYPASS GRAFT  1995  . EYE SURGERY Left    cataract with lens implant  . EYE SURGERY Right 2018   cataract with lens implant  . lumbar fusions  2010  . LUMBAR WOUND DEBRIDEMENT N/A 02/09/2017   Procedure: LUMBAR WOUND REVISION WITH REMOVAL OF PALTE;  Surgeon: Tia Alert, MD;  Location: [facility] OR;  Service: Neurosurgery;  Laterality: N/A;  . OTHER SURGICAL HISTORY  2011   scar revision d/t fistula lumbar fusion  . SHOULDER SURGERY    . UPPER GI ENDOSCOPY  2010, 2011    Family History  Problem Relation Age of Onset  . Atrial fibrillation Mother   . Heart disease Father     Social History   Socioeconomic History  . Marital status: Married    Spouse name: Not on file  . Number of children: Not on file  . Years of education: Not on file  . Highest education level: Not on file  Occupational History  . Not on file  Tobacco Use  . Smoking status: Never Smoker  . Smokeless tobacco: Never Used  Vaping Use  . Vaping Use: Never used  Substance and Sexual Activity  . Alcohol use: No  . Drug use: No  . Sexual activity: Not on file  Other Topics Concern  . Not on file  Social History Narrative  . Not  on file   Social Determinants of Health   Financial Resource Strain:   . Difficulty of Paying Living Expenses: Not on file  Food Insecurity:   . Worried About Programme researcher, broadcasting/film/video in the Last Year: Not on file  . Ran Out of Food in the Last Year: Not on file  Transportation Needs:   . Lack of Transportation (Medical): Not on file  . Lack of Transportation (Non-Medical): Not on file  Physical Activity:   . Days of Exercise per Week: Not on file  . Minutes of Exercise per Session: Not on file  Stress:   . Feeling of Stress : Not on file  Social Connections:   . Frequency of Communication with Friends and Family: Not on file  . Frequency of Social Gatherings with Friends and Family: Not on file  . Attends Religious Services: Not on file  . Active Member of Clubs or Organizations: Not on file  . Attends Banker Meetings: Not on file  . Marital Status: Not on file  Intimate Partner Violence:   . Fear of Current or Ex-Partner: Not on file  . Emotionally Abused: Not on file  . Physically Abused:  Not on file  . Sexually Abused: Not on file     Current Outpatient Medications:  .  acetaminophen (TYLENOL) 500 MG tablet, Take 500 mg by mouth every 6 (six) hours as needed for pain., Disp: , Rfl:  .  Aspirin Buf,CaCarb-MgCarb-MgO, 81 MG TABS, Take by mouth., Disp: , Rfl:  .  carvedilol (COREG) 6.25 MG tablet, Take 1 tablet (6.25 mg total) by mouth 2 (two) times daily with a meal., Disp: 180 tablet, Rfl: 3. .  clopidogrel (PLAVIX) 75 MG tablet, Take 75 mg by mouth daily., Disp: , Rfl:  .  cyclobenzaprine (FLEXERIL) 10 MG tablet, Take 1 tablet (10 mg total) by mouth at bedtime., Disp: 90 tablet, Rfl: 3 .  FLUoxetine (PROZAC) 20 MG capsule, Take 20 mg by mouth 2 (two) times daily. , Disp: , Rfl:  .  gabapentin (NEURONTIN) 100 MG capsule, Take 100 mg by mouth 3 (three) times daily., Disp: , Rfl:  .  Glucagon (BAQSIMI ONE PACK) 3 MG/DOSE POWD, 3 mg by Left Nare route once as needed  for up to 1 dose. For severe hypoglycemia with impaired consciousness, Disp: , Rfl:  .  HUMALOG 100 UNIT/ML injection, Inject into the skin 3 (three) times daily with meals. Per sliding scale, Disp: , Rfl:  .  insulin degludec (TRESIBA FLEXTOUCH) 100 UNIT/ML FlexTouch Pen, Inject into the skin., Disp: , Rfl:  .  LANTUS 100 UNIT/ML injection, Inject 4-12 Units into the skin See admin instructions. 4 units in the morning, 8-12 units in the evening, Disp: , Rfl:  .  lisinopril (ZESTRIL) 2.5 MG tablet, Take 1 tablet (2.5 mg total) by mouth at bedtime., Disp: 90 tablet, Rfl: 3 .  oxyCODONE-acetaminophen (PERCOCET) 7.5-325 MG tablet, Take 1 tablet by mouth every 6 (six) hours as needed for severe pain., Disp: 30 tablet, Rfl: 0 .  pantoprazole (PROTONIX) 40 MG tablet, TAKE 1 TABLET BY MOUTH DAILY, Disp: 90 tablet, Rfl: 3 .  rosuvastatin (CRESTOR) 10 MG tablet, Take 1 tablet (10 mg total) by mouth at bedtime., Disp: 90 tablet, Rfl: 3 .  topiramate (TOPAMAX) 50 MG tablet, Take 1 tablet (50 mg total) by mouth 2 (two) times daily., Disp: 180 tablet, Rfl: 3   Allergies  Allergen Reactions  . Other Itching    Steri strips     ROS Review of Systems  Constitutional: Negative.  Negative for appetite change and diaphoresis.  HENT: Negative.  Negative for ear pain, hearing loss and postnasal drip.   Eyes: Negative.   Respiratory: Negative.   Cardiovascular: Negative.   Gastrointestinal: Negative.  Negative for blood in stool, constipation and nausea.  Endocrine: Negative.   Genitourinary: Negative.  Negative for flank pain, hematuria, pelvic pain and urgency.  Musculoskeletal: Positive for arthralgias. Negative for joint swelling and neck pain.  Skin: Negative.   Allergic/Immunologic: Negative.   Neurological: Negative.   Hematological: Negative.  Negative for adenopathy.  Psychiatric/Behavioral: Negative.  Negative for confusion. The patient is not hyperactive.   All other systems reviewed and are  negative.     Objective:    Physical Exam Vitals reviewed.  Constitutional:      Appearance: Normal appearance.  HENT:     Mouth/Throat:     Mouth: Mucous membranes are moist.  Eyes:     Pupils: Pupils are equal, round, and reactive to light.  Neck:     Vascular: No carotid bruit.  Cardiovascular:     Rate and Rhythm: Normal rate and regular rhythm.  Chest Wall: PMI is not displaced.     Pulses: Normal pulses.     Heart sounds: Murmur heard.  Crescendo decrescendo systolic murmur is present with a grade of 2/6.  No S3 sounds.   Pulmonary:     Effort: Pulmonary effort is normal.     Breath sounds: Normal breath sounds.  Abdominal:     General: Bowel sounds are normal.     Palpations: Abdomen is soft. There is no hepatomegaly, splenomegaly or mass.     Tenderness: There is no abdominal tenderness.     Hernia: No hernia is present.  Musculoskeletal:        General: No tenderness.     Cervical back: Neck supple.     Right lower leg: No edema.     Left lower leg: No edema.  Skin:    Findings: No rash.  Neurological:     Mental Status: She is alert and oriented to person, place, and time.     Motor: No weakness.  Psychiatric:        Mood and Affect: Mood and affect normal.        Behavior: Behavior normal.     BP 132/84   Pulse 78   Ht 5\' 4"  (1.626 m)   Wt 142 lb 4.8 oz (64.5 kg)   BMI 24.43 kg/m  Wt Readings from Last 3 Encounters:  12/11/19 142 lb 4.8 oz (64.5 kg)  09/11/19 139 lb (63 kg)  02/09/17 150 lb (68 kg)     Health Maintenance Due  Topic Date Due  . Hepatitis C Screening  Never done  . FOOT EXAM  Never done  . COLONOSCOPY  Never done  . HEMOGLOBIN A1C  08/09/2017  . MAMMOGRAM  09/01/2018  . DEXA SCAN  Never done  . PNA vac Low Risk Adult (1 of 2 - PCV13) 10/27/2018    There are no preventive care reminders to display for this patient.  No results found for: TSH Lab Results  Component Value Date   WBC 6.9 02/09/2017   HGB 13.1  02/09/2017   HCT 40.6 02/09/2017   MCV 96.7 02/09/2017   PLT 272 02/09/2017   Lab Results  Component Value Date   NA 139 02/09/2017   K 4.9 02/09/2017   CO2 18 (L) 02/09/2017   GLUCOSE 118 (H) 02/09/2017   BUN 25 (H) 02/09/2017   CREATININE 1.03 (H) 02/09/2017   CALCIUM 9.9 02/09/2017   ANIONGAP 13 02/09/2017   No results found for: CHOL No results found for: HDL No results found for: LDLCALC No results found for: TRIG No results found for: CHOLHDL Lab Results  Component Value Date   HGBA1C 6.8 (H) 02/09/2017      Assessment & Plan:   Problem List Items Addressed This Visit      Cardiovascular and Mediastinum   Essential hypertension    - Today, the patient's blood pressure is well managed on present regimen. - The patient will continue the current treatment regimen.  - I encouraged the patient to eat a low-sodium diet to help control blood pressure. - I encouraged the patient to live an active lifestyle and complete activities that increases heart rate to 85% target heart rate at least 5 times per week for one hour.          Relevant Medications   rosuvastatin (CRESTOR) 10 MG tablet     Respiratory   Seasonal allergic rhinitis due to pollen    Patient was advised  to take Claritin 5 mg as needed        Endocrine   Controlled type 1 diabetes mellitus with stable proliferative retinopathy of both eyes (HCC)    Patient blood sugar is under control on Tresiba      Relevant Medications   rosuvastatin (CRESTOR) 10 MG tablet     Other   Need for influenza vaccination - Primary    Patient was vaccinated against flu.      Relevant Orders   Flu Vaccine QUAD High Dose(Fluad) (Completed)   Physical exam, annual    Patient denies any chest pain or shortness of breath.  Is keeping her weight down.  Her blood sugar is under control.  HEENT examination is normal neck is supple.  There is no goiter.  Cardiovascular system examination revealed grade 2 x 6 systolic  murmur along the left sternal border.  Chest is clear abdominal soft nontender she is status post hysterectomy.  There is no pedal edema or calf tenderness.         Meds ordered this encounter  Medications  . cyclobenzaprine (FLEXERIL) 10 MG tablet    Sig: Take 1 tablet (10 mg total) by mouth at bedtime.    Dispense:  90 tablet    Refill:  3  . rosuvastatin (CRESTOR) 10 MG tablet    Sig: Take 1 tablet (10 mg total) by mouth at bedtime.    Dispense:  90 tablet    Refill:  3    Follow-up: No follow-ups on file.    Corky Downs, MD

## 2019-12-11 NOTE — Assessment & Plan Note (Signed)
-   Today, the patient's blood pressure is well managed on present  regimen. - The patient will continue the current treatment regimen.  - I encouraged the patient to eat a low-sodium diet to help control blood pressure. - I encouraged the patient to live an active lifestyle and complete activities that increases heart rate to 85% target heart rate at least 5 times per week for one hour.    

## 2019-12-24 ENCOUNTER — Other Ambulatory Visit: Payer: Self-pay | Admitting: Internal Medicine

## 2019-12-24 DIAGNOSIS — U071 COVID-19: Secondary | ICD-10-CM | POA: Diagnosis not present

## 2019-12-25 ENCOUNTER — Ambulatory Visit: Payer: Medicare Other | Admitting: Internal Medicine

## 2020-02-25 ENCOUNTER — Other Ambulatory Visit: Payer: Self-pay | Admitting: *Deleted

## 2020-02-25 ENCOUNTER — Other Ambulatory Visit: Payer: Self-pay | Admitting: Internal Medicine

## 2020-02-25 MED ORDER — CLOPIDOGREL BISULFATE 75 MG PO TABS: 75.0000 mg | ORAL_TABLET | Freq: Every day | ORAL | 3 refills | Status: DC

## 2020-02-27 ENCOUNTER — Other Ambulatory Visit: Payer: Self-pay | Admitting: *Deleted

## 2020-02-27 MED ORDER — FLUOXETINE HCL 20 MG PO CAPS: 20.0000 mg | ORAL_CAPSULE | Freq: Two times a day (BID) | ORAL | 3 refills | Status: DC

## 2020-03-11 ENCOUNTER — Ambulatory Visit: Payer: Medicare Other | Admitting: Internal Medicine

## 2020-03-18 ENCOUNTER — Ambulatory Visit: Payer: Medicare Other | Admitting: Internal Medicine

## 2020-03-25 ENCOUNTER — Encounter: Payer: Self-pay | Admitting: Internal Medicine

## 2020-03-25 ENCOUNTER — Other Ambulatory Visit: Payer: Self-pay

## 2020-03-25 ENCOUNTER — Ambulatory Visit (INDEPENDENT_AMBULATORY_CARE_PROVIDER_SITE_OTHER): Payer: Medicare Other | Admitting: Internal Medicine

## 2020-03-25 VITALS — BP 141/76 | HR 78 | Ht 65.0 in | Wt 143.5 lb

## 2020-03-25 DIAGNOSIS — H35043 Retinal micro-aneurysms, unspecified, bilateral: Secondary | ICD-10-CM | POA: Diagnosis not present

## 2020-03-25 DIAGNOSIS — M19041 Primary osteoarthritis, right hand: Secondary | ICD-10-CM

## 2020-03-25 DIAGNOSIS — I10 Essential (primary) hypertension: Secondary | ICD-10-CM | POA: Diagnosis not present

## 2020-03-25 DIAGNOSIS — H43811 Vitreous degeneration, right eye: Secondary | ICD-10-CM | POA: Diagnosis not present

## 2020-03-25 LAB — HM MAMMOGRAPHY: HM Mammogram: NORMAL (ref 0–4)

## 2020-03-25 NOTE — Progress Notes (Signed)
Established Patient Office Visit  Subjective:  Patient ID: Gail Franco, female    DOB: 1955/10/13  Age: 65 y.o. MRN: [id number]  CC:  Chief Complaint  Patient presents with  . Follow-up    HPI  Gail Franco presents for check up  Patient is known to have hypertension diabetes.  Her diabetes is being taken care of by Dr. Jamie Brookes.  She denies any history of chest pain nausea vomiting or shortness of breath.  She has arthritis of the right hand with the right little finger.  Denies any history of swelling of the leg.  Patient has recovered from her Covid infection and is not having any symptoms at the present time.  Diabetes is under good control.  She has a history of myocardial infarction in the remote past  Past Medical History:  Diagnosis Date  . Arthritis   . Diabetes mellitus without complication (HCC)    Type 1  . Diffuse cystic mastopathy   . Myocardial infarction (HCC)   . PONV (postoperative nausea and vomiting)    always gets nauseated, needs Zofran  . Thyroid disease 2008   no longer on medications    Past Surgical History:  Procedure Laterality Date  . ABDOMINAL HYSTERECTOMY  1992  . APPENDECTOMY    . BREAST BIOPSY  1992  . CARDIAC CATHETERIZATION  2010  . CARPAL TUNNEL RELEASE    . COLONOSCOPY  2010  . CORONARY ARTERY BYPASS GRAFT  1995  . EYE SURGERY Left    cataract with lens implant  . EYE SURGERY Right 2018   cataract with lens implant  . lumbar fusions  2010  . LUMBAR WOUND DEBRIDEMENT N/A 02/09/2017   Procedure: LUMBAR WOUND REVISION WITH REMOVAL OF PALTE;  Surgeon: Tia Alert, MD;  Location: [facility] OR;  Service: Neurosurgery;  Laterality: N/A;  . OTHER SURGICAL HISTORY  2011   scar revision d/t fistula lumbar fusion  . SHOULDER SURGERY    . UPPER GI ENDOSCOPY  2010, 2011    Family History  Problem Relation Age of Onset  . Atrial fibrillation Mother   . Heart disease Father     Social History   Socioeconomic History  .  Marital status: Married    Spouse name: Not on file  . Number of children: Not on file  . Years of education: Not on file  . Highest education level: Not on file  Occupational History  . Not on file  Tobacco Use  . Smoking status: Never Smoker  . Smokeless tobacco: Never Used  Vaping Use  . Vaping Use: Never used  Substance and Sexual Activity  . Alcohol use: No  . Drug use: No  . Sexual activity: Not on file  Other Topics Concern  . Not on file  Social History Narrative  . Not on file   Social Determinants of Health   Financial Resource Strain: Not on file  Food Insecurity: Not on file  Transportation Needs: Not on file  Physical Activity: Not on file  Stress: Not on file  Social Connections: Not on file  Intimate Partner Violence: Not on file     Current Outpatient Medications:  .  acetaminophen (TYLENOL) 500 MG tablet, Take 500 mg by mouth every 6 (six) hours as needed for pain., Disp: , Rfl:  .  Aspirin Buf,CaCarb-MgCarb-MgO, 81 MG TABS, Take by mouth., Disp: , Rfl:  .  carvedilol (COREG) 6.25 MG tablet, Take 1 tablet (6.25 mg total) by  mouth 2 (two) times daily with a meal., Disp: 180 tablet, Rfl: 3. .  clopidogrel (PLAVIX) 75 MG tablet, Take 1 tablet (75 mg total) by mouth daily., Disp: 90 tablet, Rfl: 3 .  cyclobenzaprine (FLEXERIL) 10 MG tablet, Take 1 tablet (10 mg total) by mouth at bedtime., Disp: 90 tablet, Rfl: 3 .  FLUoxetine (PROZAC) 20 MG capsule, Take 1 capsule (20 mg total) by mouth 2 (two) times daily., Disp: 180 capsule, Rfl: 3 .  gabapentin (NEURONTIN) 100 MG capsule, Take 100 mg by mouth 3 (three) times daily., Disp: , Rfl:  .  Glucagon (BAQSIMI ONE PACK) 3 MG/DOSE POWD, 3 mg by Left Nare route once as needed for up to 1 dose. For severe hypoglycemia with impaired consciousness, Disp: , Rfl:  .  HUMALOG 100 UNIT/ML injection, Inject into the skin 3 (three) times daily with meals. Per sliding scale, Disp: , Rfl:  .  insulin degludec (TRESIBA FLEXTOUCH)  100 UNIT/ML FlexTouch Pen, Inject into the skin., Disp: , Rfl:  .  LANTUS 100 UNIT/ML injection, Inject 4-12 Units into the skin See admin instructions. 4 units in the morning, 8-12 units in the evening, Disp: , Rfl:  .  lisinopril (ZESTRIL) 2.5 MG tablet, Take 1 tablet (2.5 mg total) by mouth at bedtime., Disp: 90 tablet, Rfl: 3 .  oxyCODONE-acetaminophen (PERCOCET) 7.5-325 MG tablet, Take 1 tablet by mouth every 6 (six) hours as needed for severe pain., Disp: 30 tablet, Rfl: 0 .  pantoprazole (PROTONIX) 40 MG tablet, TAKE 1 TABLET BY MOUTH DAILY, Disp: 90 tablet, Rfl: 3 .  rosuvastatin (CRESTOR) 10 MG tablet, Take 1 tablet (10 mg total) by mouth at bedtime., Disp: 90 tablet, Rfl: 3 .  topiramate (TOPAMAX) 50 MG tablet, Take 1 tablet (50 mg total) by mouth 2 (two) times daily., Disp: 180 tablet, Rfl: 3   Allergies  Allergen Reactions  . Other Itching    Steri strips     ROS Review of Systems  Constitutional: Negative.   HENT: Negative.   Eyes: Negative.   Respiratory: Negative.   Cardiovascular: Negative.   Gastrointestinal: Negative.   Endocrine: Negative.   Genitourinary: Negative.   Musculoskeletal: Negative.   Skin: Negative.   Allergic/Immunologic: Negative.   Neurological: Negative.   Hematological: Negative.   Psychiatric/Behavioral: Negative.   All other systems reviewed and are negative.     Objective:    Physical Exam Vitals reviewed.  Constitutional:      Appearance: Normal appearance.  HENT:     Mouth/Throat:     Mouth: Mucous membranes are moist.  Eyes:     Pupils: Pupils are equal, round, and reactive to light.  Neck:     Vascular: No carotid bruit.  Cardiovascular:     Rate and Rhythm: Normal rate and regular rhythm.     Pulses: Normal pulses.     Heart sounds: Normal heart sounds.  Pulmonary:     Effort: Pulmonary effort is normal.     Breath sounds: Normal breath sounds.  Abdominal:     General: Bowel sounds are normal.     Palpations:  Abdomen is soft. There is no hepatomegaly, splenomegaly or mass.     Tenderness: There is no abdominal tenderness.     Hernia: No hernia is present.  Musculoskeletal:        General: No tenderness.     Cervical back: Neck supple.     Right lower leg: No edema.     Left lower leg: No edema.  Skin:    Findings: No rash.  Neurological:     Mental Status: She is alert and oriented to person, place, and time.     Motor: No weakness.  Psychiatric:        Mood and Affect: Mood and affect normal.        Behavior: Behavior normal.     BP (!) 141/76   Pulse 78   Ht 5\' 5"  (1.651 m)   Wt 143 lb 8 oz (65.1 kg)   BMI 23.88 kg/m  Wt Readings from Last 3 Encounters:  03/25/20 143 lb 8 oz (65.1 kg)  12/11/19 142 lb 4.8 oz (64.5 kg)  09/11/19 139 lb (63 kg)     Health Maintenance Due  Topic Date Due  . Hepatitis C Screening  Never done  . FOOT EXAM  Never done  . OPHTHALMOLOGY EXAM  Never done  . COLONOSCOPY (Pts 45-64yrs Insurance coverage will need to be confirmed)  Never done  . HEMOGLOBIN A1C  08/09/2017  . MAMMOGRAM  09/01/2018  . DEXA SCAN  Never done  . PNA vac Low Risk Adult (1 of 2 - PCV13) 10/27/2018  . COVID-19 Vaccine (3 - Booster for Pfizer series) 09/06/2019    There are no preventive care reminders to display for this patient.  No results found for: TSH Lab Results  Component Value Date   WBC 6.9 02/09/2017   HGB 13.1 02/09/2017   HCT 40.6 02/09/2017   MCV 96.7 02/09/2017   PLT 272 02/09/2017   Lab Results  Component Value Date   NA 139 02/09/2017   K 4.9 02/09/2017   CO2 18 (L) 02/09/2017   GLUCOSE 118 (H) 02/09/2017   BUN 25 (H) 02/09/2017   CREATININE 1.03 (H) 02/09/2017   CALCIUM 9.9 02/09/2017   ANIONGAP 13 02/09/2017   No results found for: CHOL No results found for: HDL No results found for: LDLCALC No results found for: TRIG No results found for: CHOLHDL Lab Results  Component Value Date   HGBA1C 6.8 (H) 02/09/2017      Assessment &  Plan:   Problem List Items Addressed This Visit      Cardiovascular and Mediastinum   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      Essential hypertension    Blood pressure is under control on the present medication.        Musculoskeletal and Integument   Arthritis of finger of right hand    Refer to orthopedics, x-ray shows severe osteoarthritis of the right little finger.        Other   Posterior vitreous detachment of right eye    Stable at the present time.         No orders of the defined types were placed in this encounter.   Follow-up: No follow-ups on file.    02/11/2017, MD

## 2020-03-25 NOTE — Assessment & Plan Note (Signed)
Refer to orthopedics, x-ray shows severe osteoarthritis of the right little finger.

## 2020-03-25 NOTE — Assessment & Plan Note (Signed)
Blood pressure is under control on the present medication.

## 2020-03-25 NOTE — Assessment & Plan Note (Signed)
Stable at the present time. 

## 2020-03-25 NOTE — Assessment & Plan Note (Signed)
Patient is being followed up by the eye doctor who regular basis.

## 2020-04-30 ENCOUNTER — Other Ambulatory Visit: Payer: Self-pay

## 2020-04-30 ENCOUNTER — Encounter (INDEPENDENT_AMBULATORY_CARE_PROVIDER_SITE_OTHER): Payer: Self-pay | Admitting: Ophthalmology

## 2020-04-30 ENCOUNTER — Ambulatory Visit (INDEPENDENT_AMBULATORY_CARE_PROVIDER_SITE_OTHER): Payer: Medicare Other | Admitting: Ophthalmology

## 2020-04-30 DIAGNOSIS — E103591 Type 1 diabetes mellitus with proliferative diabetic retinopathy without macular edema, right eye: Secondary | ICD-10-CM

## 2020-04-30 DIAGNOSIS — E103592 Type 1 diabetes mellitus with proliferative diabetic retinopathy without macular edema, left eye: Secondary | ICD-10-CM

## 2020-04-30 DIAGNOSIS — H4312 Vitreous hemorrhage, left eye: Secondary | ICD-10-CM

## 2020-04-30 DIAGNOSIS — E103553 Type 1 diabetes mellitus with stable proliferative diabetic retinopathy, bilateral: Secondary | ICD-10-CM | POA: Diagnosis not present

## 2020-04-30 NOTE — Assessment & Plan Note (Signed)
The nature of the vitreous hemorrhage was discussed with the patient as well as the common causes.   Patients with diabetic eye disease may develop retinal neovascularization.  Other eye conditions develop retinal  neovascularization secondary to retinal venous occlusions.   Vitreous hemorrhage may result from spontaneous vitreous detachment or retinal breaks.  Blunt trauma is a common cause as well.  The need for serial evaluation of the fundus (interior of the eye) until  clear views are obtained was addressed. An occasional need  to monitor the condition by in office  B-scan ultrasonography in the case of dense vitreous hemorrhage was discussed.  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

## 2020-04-30 NOTE — Assessment & Plan Note (Signed)
Active disease, with preretinal boat shaped hemorrhage left eye, minimal symptoms.  Yet there is room anteriorly for peripheral PRP this will be done to prevent neovascular proliferations

## 2020-04-30 NOTE — Progress Notes (Signed)
04/30/2020     CHIEF COMPLAINT Patient presents for Retina Follow Up (1 Year F/U OU//Pt denies noticeable changes to [facility] since last visit. Pt denies ocular pain, flashes of light, or floaters OU. //A1c: 7.1 approx, 5 mo ago approx/LBS: 83 this AM)   HISTORY OF PRESENT ILLNESS: Gail Franco is a 65 y.o. female who presents to the clinic today for:   HPI    Retina Follow Up    Patient presents with  Diabetic Retinopathy.  In both eyes.  This started 1 year ago.  Severity is mild.  Duration of 1 year.  Since onset it is stable. Additional comments: 1 Year F/U OU  Pt denies noticeable changes to [facility] OU since last visit. Pt denies ocular pain, flashes of light, or floaters OU.   A1c: 7.1 approx, 5 mo ago approx LBS: 83 this AM       Last edited by Ileana RoupKennerly, Paige, COA on 04/30/2020 10:47 AM. (History)      Referring physician: Corky DownsMasoud, Javed, MD [address] [place] [id number]  HISTORICAL INFORMATION:   Selected notes from the MEDICAL RECORD NUMBER    Lab Results  Component Value Date   HGBA1C 6.8 (H) 02/09/2017     CURRENT MEDICATIONS: No current outpatient medications on file. (Ophthalmic Drugs)   No current facility-administered medications for this visit. (Ophthalmic Drugs)   Current Outpatient Medications (Other)  Medication Sig  . acetaminophen (TYLENOL) 500 MG tablet Take 500 mg by mouth every 6 (six) hours as needed for pain.  . Aspirin Buf,CaCarb-MgCarb-MgO, 81 MG TABS Take by mouth.  . carvedilol (COREG) 6.25 MG tablet Take 1 tablet (6.25 mg total) by mouth 2 (two) times daily with a meal.  . clopidogrel (PLAVIX) 75 MG tablet Take 1 tablet (75 mg total) by mouth daily.  . cyclobenzaprine (FLEXERIL) 10 MG tablet Take 1 tablet (10 mg total) by mouth at bedtime.  Marland Kitchen. FLUoxetine (PROZAC) 20 MG capsule Take 1 capsule (20 mg total) by mouth 2 (two) times daily.  Marland Kitchen. gabapentin (NEURONTIN) 100 MG capsule Take 100 mg by mouth 3 (three) times daily.  . Glucagon  (BAQSIMI ONE PACK) 3 MG/DOSE POWD 3 mg by Left Nare route once as needed for up to 1 dose. For severe hypoglycemia with impaired consciousness  . HUMALOG 100 UNIT/ML injection Inject into the skin 3 (three) times daily with meals. Per sliding scale  . insulin degludec (TRESIBA FLEXTOUCH) 100 UNIT/ML FlexTouch Pen Inject into the skin.  Marland Kitchen. LANTUS 100 UNIT/ML injection Inject 4-12 Units into the skin See admin instructions. 4 units in the morning, 8-12 units in the evening  . lisinopril (ZESTRIL) 2.5 MG tablet Take 1 tablet (2.5 mg total) by mouth at bedtime.  Marland Kitchen. oxyCODONE-acetaminophen (PERCOCET) 7.5-325 MG tablet Take 1 tablet by mouth every 6 (six) hours as needed for severe pain.  . pantoprazole (PROTONIX) 40 MG tablet TAKE 1 TABLET BY MOUTH DAILY  . rosuvastatin (CRESTOR) 10 MG tablet Take 1 tablet (10 mg total) by mouth at bedtime.  . topiramate (TOPAMAX) 50 MG tablet Take 1 tablet (50 mg total) by mouth 2 (two) times daily.   No current facility-administered medications for this visit. (Other)      REVIEW OF SYSTEMS:    ALLERGIES Allergies  Allergen Reactions  . Other Itching    Steri strips     PAST MEDICAL HISTORY Past Medical History:  Diagnosis Date  . Arthritis   . Diabetes mellitus without complication (  HCC)    Type 1  . Diffuse cystic mastopathy   . Myocardial infarction (HCC)   . PONV (postoperative nausea and vomiting)    always gets nauseated, needs Zofran  . Thyroid disease 2008   no longer on medications   Past Surgical History:  Procedure Laterality Date  . ABDOMINAL HYSTERECTOMY  1992  . APPENDECTOMY    . BREAST BIOPSY  1992  . CARDIAC CATHETERIZATION  2010  . CARPAL TUNNEL RELEASE    . COLONOSCOPY  2010  . CORONARY ARTERY BYPASS GRAFT  1995  . EYE SURGERY Left    cataract with lens implant  . EYE SURGERY Right 2018   cataract with lens implant  . lumbar fusions  2010  . LUMBAR WOUND DEBRIDEMENT N/A 02/09/2017   Procedure: LUMBAR WOUND REVISION  WITH REMOVAL OF PALTE;  Surgeon: Tia Alert, MD;  Location: [facility] OR;  Service: Neurosurgery;  Laterality: N/A;  . OTHER SURGICAL HISTORY  2011   scar revision d/t fistula lumbar fusion  . SHOULDER SURGERY    . UPPER GI ENDOSCOPY  2010, 2011    FAMILY HISTORY Family History  Problem Relation Age of Onset  . Atrial fibrillation Mother   . Heart disease Father     SOCIAL HISTORY Social History   Tobacco Use  . Smoking status: Never Smoker  . Smokeless tobacco: Never Used  Vaping Use  . Vaping Use: Never used  Substance Use Topics  . Alcohol use: No  . Drug use: No         OPHTHALMIC EXAM:  Base Eye Exam    Visual Acuity (ETDRS)      Right Left   Dist [place] 20/20 -2 20/30 -2   Dist ph [place]  20/25 +1       Tonometry (Tonopen, 10:47 AM)      Right Left   Pressure 15 15       Pupils      Pupils Dark Light Shape React APD   Right PERRL 4 3 Round Brisk None   Left PERRL 4 3 Round Brisk None       Visual Fields (Counting fingers)      Left Right    Full Full       Extraocular Movement      Right Left    Full Full       Neuro/Psych    Oriented x3: Yes   Mood/Affect: Normal       Dilation    Both eyes: 1.0% Mydriacyl, 2.5% Phenylephrine @ 10:50 AM        Slit Lamp and Fundus Exam    External Exam      Right Left   External Normal Normal       Slit Lamp Exam      Right Left   Lids/Lashes Normal Normal   Conjunctiva/Sclera White and quiet White and quiet   Cornea Clear Clear   Anterior Chamber Deep and quiet Deep and quiet   Iris Round and reactive Round and reactive   Lens Posterior chamber intraocular lens Posterior chamber intraocular lens   Anterior Vitreous Posterior vitreous detachment Posterior vitreous detachment       Fundus Exam      Right Left   Posterior Vitreous Posterior vitreous detachment Posterior vitreous detachment, Pre-retinal hemorrhage boat shaped hemorrhage inferior to macula, suggesting need for anterior rim PRP in areas  of previous nontherapy   Disc Normal Normal   C/D Ratio 0.4 0.4  Macula Microaneurysms Microaneurysms   Vessels PDR-quiet  diabetic retinopathy OS PDR, active with preretinal vitreous hemorrhage   Periphery Diabetic retinopathy, quiet, good PRP Diabetic retinopathy, active with preretinal hemorrhage inferiorly, room anterior for more laser          IMAGING AND PROCEDURES  Imaging and Procedures for 04/30/20  Color Fundus Photography Optos - OU - Both Eyes       Right Eye Progression has been stable. Disc findings include normal observations. Macula : normal observations, microaneurysms.   Left Eye Progression has worsened. Disc findings include normal observations. Macula : microaneurysms.   Notes Quiescent PDR OD, clear media good PRP  OS with new preretinal boat shaped hemorrhage inferiorly macula secondary to PDR progression locally.  Room anteriorly for PRP will complete today       Panretinal Photocoagulation - OS - Left Eye       Time Out Confirmed correct patient, procedure, site, and patient consented.   Anesthesia Topical anesthesia was used. Anesthetic medications included Proparacaine 0.5%.   Laser Information The type of laser was diode. Color was yellow. The duration in seconds was 0.03. The spot size was 390 microns. Laser power was 300. Total spots was 799.   Post-op The patient tolerated the procedure well. There were no complications. The patient received written and verbal post procedure care education.   Notes Additional anterior retina PRP applied nasally and inferiorly.                ASSESSMENT/PLAN:  Proliferative diabetic retinopathy of left eye determined by examination associated with type 1 diabetes mellitus (HCC) Active disease, with preretinal boat shaped hemorrhage left eye, minimal symptoms.  Yet there is room anteriorly for peripheral PRP this will be done to prevent neovascular proliferations  Vitreous hemorrhage, left  eye (HCC) The nature of the vitreous hemorrhage was discussed with the patient as well as the common causes.   Patients with diabetic eye disease may develop retinal neovascularization.  Other eye conditions develop retinal  neovascularization secondary to retinal venous occlusions.   Vitreous hemorrhage may result from spontaneous vitreous detachment or retinal breaks.  Blunt trauma is a common cause as well.  The need for serial evaluation of the fundus (interior of the eye) until  clear views are obtained was addressed. An occasional need  to monitor the condition by in office  B-scan ultrasonography in the case of dense vitreous hemorrhage was discussed.  OS secondary to PDR active clear, patient to notify the office promptly if new onset symptoms blurring or distortion develop      ICD-10-CM   1. Proliferative diabetic retinopathy of left eye determined by examination associated with type 1 diabetes mellitus (HCC)  Y86.5784 Panretinal Photocoagulation - OS - Left Eye  2. Controlled type 1 diabetes mellitus with stable proliferative retinopathy of both eyes (HCC)  O96.2952 Color Fundus Photography Optos - OU - Both Eyes  3. Diabetic maculopathy of right eye with proliferative retinopathy determined by examination associated with type 1 diabetes mellitus (HCC)  E10.3591   4. Vitreous hemorrhage, left eye (HCC)  H43.12     1.  Patient may return anytime should dispersion of hemorrhage or worsening of hemorrhage develop in the left eye.  2.  3.  Ophthalmic Meds Ordered this visit:  No orders of the defined types were placed in this encounter.      Return in about 6 months (around 10/30/2020) for DILATE OU, COLOR FP.  There are no Patient Instructions on  file for this visit.   Explained the diagnoses, plan, and follow up with the patient and they expressed understanding.  Patient expressed understanding of the importance of proper follow up care.   Alford Highland Dawsyn Ramsaran M.D. Diseases &  Surgery of the Retina and Vitreous [facility] 04/30/20     Abbreviations: M myopia (nearsighted); A astigmatism; H hyperopia (farsighted); P presbyopia; Mrx spectacle prescription;  CTL contact lenses; OD right eye; OS left eye; OU both eyes  XT exotropia; ET esotropia; PEK punctate epithelial keratitis; PEE punctate epithelial erosions; DES dry eye syndrome; MGD meibomian gland dysfunction; ATs artificial tears; PFAT's preservative free artificial tears; NSC nuclear sclerotic cataract; PSC posterior subcapsular cataract; ERM epi-retinal membrane; PVD posterior vitreous detachment; RD retinal detachment; DM diabetes mellitus; DR diabetic retinopathy; NPDR non-proliferative diabetic retinopathy; PDR proliferative diabetic retinopathy; CSME clinically significant macular edema; DME diabetic macular edema; dbh dot blot hemorrhages; CWS cotton wool spot; POAG primary open angle glaucoma; C/D cup-to-disc ratio; HVF humphrey visual field; GVF goldmann visual field; OCT optical coherence tomography; IOP intraocular pressure; BRVO Branch retinal vein occlusion; CRVO central retinal vein occlusion; CRAO central retinal artery occlusion; BRAO branch retinal artery occlusion; RT retinal tear; SB scleral buckle; PPV pars plana vitrectomy; VH Vitreous hemorrhage; PRP panretinal laser photocoagulation; IVK intravitreal kenalog; VMT vitreomacular traction; MH Macular hole;  NVD neovascularization of the disc; NVE neovascularization elsewhere; AREDS age related eye disease study; ARMD age related macular degeneration; POAG primary open angle glaucoma; EBMD epithelial/anterior basement membrane dystrophy; ACIOL anterior chamber intraocular lens; IOL intraocular lens; PCIOL posterior chamber intraocular lens; Phaco/IOL phacoemulsification with intraocular lens placement; PRK photorefractive keratectomy; LASIK laser assisted in situ keratomileusis; HTN hypertension; DM diabetes mellitus; COPD chronic  obstructive pulmonary disease

## 2020-05-20 ENCOUNTER — Other Ambulatory Visit: Payer: Self-pay | Admitting: *Deleted

## 2020-05-20 ENCOUNTER — Other Ambulatory Visit: Payer: Self-pay

## 2020-05-20 MED ORDER — LISINOPRIL 2.5 MG PO TABS: 2.5000 mg | ORAL_TABLET | Freq: Every day | ORAL | 3 refills | Status: DC

## 2020-05-20 MED ORDER — TOPIRAMATE 50 MG PO TABS: 50.0000 mg | ORAL_TABLET | Freq: Two times a day (BID) | ORAL | 3 refills | Status: DC

## 2020-06-04 ENCOUNTER — Other Ambulatory Visit: Payer: Self-pay | Admitting: *Deleted

## 2020-06-04 MED ORDER — CYCLOBENZAPRINE HCL 10 MG PO TABS: 10.0000 mg | ORAL_TABLET | Freq: Every day | ORAL | 3 refills | Status: DC

## 2020-06-17 ENCOUNTER — Ambulatory Visit (INDEPENDENT_AMBULATORY_CARE_PROVIDER_SITE_OTHER): Payer: Medicare Other | Admitting: Ophthalmology

## 2020-06-17 ENCOUNTER — Other Ambulatory Visit: Payer: Self-pay

## 2020-06-17 ENCOUNTER — Encounter (INDEPENDENT_AMBULATORY_CARE_PROVIDER_SITE_OTHER): Payer: Self-pay | Admitting: Ophthalmology

## 2020-06-17 DIAGNOSIS — H4312 Vitreous hemorrhage, left eye: Secondary | ICD-10-CM

## 2020-06-17 DIAGNOSIS — E103592 Type 1 diabetes mellitus with proliferative diabetic retinopathy without macular edema, left eye: Secondary | ICD-10-CM

## 2020-06-17 DIAGNOSIS — E103591 Type 1 diabetes mellitus with proliferative diabetic retinopathy without macular edema, right eye: Secondary | ICD-10-CM

## 2020-06-17 DIAGNOSIS — E103553 Type 1 diabetes mellitus with stable proliferative diabetic retinopathy, bilateral: Secondary | ICD-10-CM

## 2020-06-17 NOTE — Progress Notes (Signed)
06/17/2020     CHIEF COMPLAINT Patient presents for Retina Follow Up (WIP- Lines/Spots at times in OS/Pt states, "Every since I was here for laser OS in April I have been noticing that I have occasional lines and spots in my va OS. I do not see them everyday but I am seeing them more and more and I am concerned about where they could be coming from."/A1C:6.9/LBS: 139)   HISTORY OF PRESENT ILLNESS: Gail Franco is a 65 y.o. female who presents to the clinic today for:   HPI    Retina Follow Up    Diagnosis: Diabetic Retinopathy   Laterality: left eye   Onset: 6 weeks ago   Severity: mild   Duration: 6 weeks   Course: stable   Comments: WIP- Lines/Spots at times in OS Pt states, "Every since I was here for laser OS in April I have been noticing that I have occasional lines and spots in my va OS. I do not see them everyday but I am seeing them more and more and I am concerned about where they could be coming from." A1C:6.9 LBS: 139       Last edited by Demetrios Loll, COA on 06/17/2020  9:14 AM. (History)      Referring physician: Corky Downs, MD [address] [place],  [place] [address]  HISTORICAL INFORMATION:   Selected notes from the MEDICAL RECORD NUMBER    Lab Results  Component Value Date   HGBA1C 6.8 (H) 02/09/2017     CURRENT MEDICATIONS: No current outpatient medications on file. (Ophthalmic Drugs)   No current facility-administered medications for this visit. (Ophthalmic Drugs)   Current Outpatient Medications (Other)  Medication Sig  . acetaminophen (TYLENOL) 500 MG tablet Take 500 mg by mouth every 6 (six) hours as needed for pain.  . Aspirin Buf,CaCarb-MgCarb-MgO, 81 MG TABS Take by mouth.  . carvedilol (COREG) 6.25 MG tablet Take 1 tablet (6.25 mg total) by mouth 2 (two) times daily with a meal.  . clopidogrel (PLAVIX) 75 MG tablet Take 1 tablet (75 mg total) by mouth daily.  . cyclobenzaprine (FLEXERIL) 10 MG tablet Take 1 tablet (10 mg total) by  mouth at bedtime.  Marland Kitchen FLUoxetine (PROZAC) 20 MG capsule Take 1 capsule (20 mg total) by mouth 2 (two) times daily.  Marland Kitchen gabapentin (NEURONTIN) 100 MG capsule Take 100 mg by mouth 3 (three) times daily.  . Glucagon (BAQSIMI ONE PACK) 3 MG/DOSE POWD 3 mg by Left Nare route once as needed for up to 1 dose. For severe hypoglycemia with impaired consciousness  . HUMALOG 100 UNIT/ML injection Inject into the skin 3 (three) times daily with meals. Per sliding scale  . insulin degludec (TRESIBA FLEXTOUCH) 100 UNIT/ML FlexTouch Pen Inject into the skin.  Marland Kitchen LANTUS 100 UNIT/ML injection Inject 4-12 Units into the skin See admin instructions. 4 units in the morning, 8-12 units in the evening  . lisinopril (ZESTRIL) 2.5 MG tablet Take 1 tablet (2.5 mg total) by mouth at bedtime.  Marland Kitchen oxyCODONE-acetaminophen (PERCOCET) 7.5-325 MG tablet Take 1 tablet by mouth every 6 (six) hours as needed for severe pain.  . pantoprazole (PROTONIX) 40 MG tablet TAKE 1 TABLET BY MOUTH DAILY  . rosuvastatin (CRESTOR) 10 MG tablet Take 1 tablet (10 mg total) by mouth at bedtime.  . topiramate (TOPAMAX) 50 MG tablet Take 1 tablet (50 mg total) by mouth 2 (two) times daily.   No current facility-administered medications for this visit. (Other)  REVIEW OF SYSTEMS:    ALLERGIES Allergies  Allergen Reactions  . Other Itching    Steri strips     PAST MEDICAL HISTORY Past Medical History:  Diagnosis Date  . Arthritis   . Diabetes mellitus without complication (HCC)    Type 1  . Diffuse cystic mastopathy   . Myocardial infarction (HCC)   . PONV (postoperative nausea and vomiting)    always gets nauseated, needs Zofran  . Thyroid disease 2008   no longer on medications   Past Surgical History:  Procedure Laterality Date  . ABDOMINAL HYSTERECTOMY  1992  . APPENDECTOMY    . BREAST BIOPSY  1992  . CARDIAC CATHETERIZATION  2010  . CARPAL TUNNEL RELEASE    . COLONOSCOPY  2010  . CORONARY ARTERY BYPASS GRAFT  1995   . EYE SURGERY Left    cataract with lens implant  . EYE SURGERY Right 2018   cataract with lens implant  . lumbar fusions  2010  . LUMBAR WOUND DEBRIDEMENT N/A 02/09/2017   Procedure: LUMBAR WOUND REVISION WITH REMOVAL OF PALTE;  Surgeon: Tia AlertJones, David S, MD;  [facility];  Service: Neurosurgery;  Laterality: N/A;  . OTHER SURGICAL HISTORY  2011   scar revision d/t fistula lumbar fusion  . SHOULDER SURGERY    . UPPER GI ENDOSCOPY  2010, 2011    FAMILY HISTORY Family History  Problem Relation Age of Onset  . Atrial fibrillation Mother   . Heart disease Father     SOCIAL HISTORY Social History   Tobacco Use  . Smoking status: Never Smoker  . Smokeless tobacco: Never Used  Vaping Use  . Vaping Use: Never used  Substance Use Topics  . Alcohol use: No  . Drug use: No         OPHTHALMIC EXAM:  Base Eye Exam    Visual Acuity (ETDRS)      Right Left   Dist [place] 20/25 -1 20/30   Dist ph [place]  20/25       Tonometry (Tonopen, 9:19 AM)      Right Left   Pressure 16 15       Pupils      Pupils Dark Light Shape React APD   Right PERRL 4 3 Round Brisk None   Left PERRL 4 3 Round Brisk None       Visual Fields (Counting fingers)      Left Right    Full Full       Extraocular Movement      Right Left    Full Full       Neuro/Psych    Oriented x3: Yes   Mood/Affect: Normal       Dilation    Left eye: 1.0% Mydriacyl, 2.5% Phenylephrine @ 9:19 AM        Slit Lamp and Fundus Exam    External Exam      Right Left   External Normal Normal       Slit Lamp Exam      Right Left   Lids/Lashes Normal Normal   Conjunctiva/Sclera White and quiet White and quiet   Cornea Clear Clear   Anterior Chamber Deep and quiet Deep and quiet   Iris Round and reactive Round and reactive   Lens Posterior chamber intraocular lens Posterior chamber intraocular lens   Anterior Vitreous Posterior vitreous detachment Posterior vitreous detachment       Fundus Exam       Right Left  Posterior Vitreous Posterior vitreous detachment Posterior vitreous detachment, Pre-retinal hemorrhage boat shaped hemorrhage inferior to macula, suggesting need for anterior rim PRP in areas of previous nontherapy   Disc Normal Normal   C/D Ratio 0.4 0.4   Macula Microaneurysms Microaneurysms   Vessels PDR-quiet  diabetic retinopathy OS PDR, active with preretinal vitreous hemorrhage   Periphery Diabetic retinopathy, quiet, good PRP Diabetic retinopathy, active with preretinal hemorrhage inferiorly, room anterior for more laser          IMAGING AND PROCEDURES  Imaging and Procedures for 06/17/20  OCT, Retina - OU - Both Eyes       Right Eye Quality was good. Scan locations included subfoveal. Central Foveal Thickness: 242. Findings include normal observations, central retinal atrophy.   Left Eye Quality was good. Scan locations included subfoveal. Central Foveal Thickness: 240. Findings include normal observations, subretinal scarring.   Notes No active clinically significant macular edema OU.  Vitreal macular adhesion from previous examinations has resolved.  Now with preretinal and vitreous hemorrhage and clear posterior vitreous detachment, no active CSME OU       Panretinal Photocoagulation - OS - Left Eye       Time Out Confirmed correct patient, procedure, site, and patient consented.   Anesthesia Anesthetic medications included Proparacaine 0.5%.   Laser Information The type of laser was diode. Color was yellow. The duration in seconds was 0.03. The spot size was 390 microns. Laser power was 300. Total spots was 754.   Post-op The patient tolerated the procedure well. There were no complications. The patient received written and verbal post procedure care education.   Notes Additional anterior retina PRP applied nasally and inferiorly,                 ASSESSMENT/PLAN:  Proliferative diabetic retinopathy of left eye determined by  examination associated with type 1 diabetes mellitus (HCC) New preretinal vitreous hemorrhage mild, now with no symptoms nonetheless seen on clinical examination boat shaped hemorrhage inferior to the macula likely related to recent PVD with tractional changes from previous neovascular tissue from the nerve.  Nonetheless there is room in this type 1 diabetes mellitus patient for peripheral anterior PRP simply to prevent retinal nonperfusion and vegF production triggering further progression of disease  PRP anteriorly delivered today  Vitreous hemorrhage, left eye (HCC) The nature of the vitreous hemorrhage was discussed with the patient as well as the common causes.   Patients with diabetic eye disease may develop retinal neovascularization.  Other eye conditions develop retinal  neovascularization secondary to retinal venous occlusions.   Vitreous hemorrhage may result from spontaneous vitreous detachment or retinal breaks.  Blunt trauma is a common cause as well.  The need for serial evaluation of the fundus (interior of the eye) until  clear views are obtained was addressed. An occasional need  to monitor the condition by in office  B-scan ultrasonography in the case of dense vitreous hemorrhage was discussed.      ICD-10-CM   1. Proliferative diabetic retinopathy of left eye determined by examination associated with type 1 diabetes mellitus (HCC)  O15.6153 Panretinal Photocoagulation - OS - Left Eye  2. Diabetic maculopathy of right eye with proliferative retinopathy determined by examination associated with type 1 diabetes mellitus (HCC)  E10.3591 OCT, Retina - OU - Both Eyes  3. Controlled type 1 diabetes mellitus with stable proliferative retinopathy of both eyes (HCC)  E10.3553   4. Vitreous hemorrhage, left eye (HCC)  H43.12  1.  We will make every attempt to complete all PRP that can be clinically delivered in the office setting today anterior retina OS.  This will diminish the risk of  progressive vascularization leading to PDR progression or vitreous hemorrhages.    2.  A long explanation with the patient that vitreous traction can lead to recurrent vitreous hemorrhages and that hemorrhage alone will not cause blindness but only floaters that if severe vision.  Head of bed elevated will help to clear this.  Other etiologies discussed with patient   3.  Patient understands that if the recurrence of the hemorrhage recur that vitrectomy, taken to remove the vitreous scaffolding in the vitreous hemorrhage would be cleared and not likely to return once proliferative tissue resected  4.  Follow-up in 10 weeks to monitor response  Ophthalmic Meds Ordered this visit:  No orders of the defined types were placed in this encounter.      Return in about 10 weeks (around 08/26/2020) for  next visit DILATE OU, COLOR FP,,, PRP OS today.  There are no Patient Instructions on file for this visit.   Explained the diagnoses, plan, and follow up with the patient and they expressed understanding.  Patient expressed understanding of the importance of proper follow up care.   Alford Highland Argusta Mcgann M.D. Diseases & Surgery of the Retina and Vitreous [facility] 06/17/20     Abbreviations: M myopia (nearsighted); A astigmatism; H hyperopia (farsighted); P presbyopia; Mrx spectacle prescription;  CTL contact lenses; OD right eye; OS left eye; OU both eyes  XT exotropia; ET esotropia; PEK punctate epithelial keratitis; PEE punctate epithelial erosions; DES dry eye syndrome; MGD meibomian gland dysfunction; ATs artificial tears; PFAT's preservative free artificial tears; NSC nuclear sclerotic cataract; PSC posterior subcapsular cataract; ERM epi-retinal membrane; PVD posterior vitreous detachment; RD retinal detachment; DM diabetes mellitus; DR diabetic retinopathy; NPDR non-proliferative diabetic retinopathy; PDR proliferative diabetic retinopathy; CSME clinically significant macular  edema; DME diabetic macular edema; dbh dot blot hemorrhages; CWS cotton wool spot; POAG primary open angle glaucoma; C/D cup-to-disc ratio; HVF humphrey visual field; GVF goldmann visual field; OCT optical coherence tomography; IOP intraocular pressure; BRVO Branch retinal vein occlusion; CRVO central retinal vein occlusion; CRAO central retinal artery occlusion; BRAO branch retinal artery occlusion; RT retinal tear; SB scleral buckle; PPV pars plana vitrectomy; VH Vitreous hemorrhage; PRP panretinal laser photocoagulation; IVK intravitreal kenalog; VMT vitreomacular traction; MH Macular hole;  NVD neovascularization of the disc; NVE neovascularization elsewhere; AREDS age related eye disease study; ARMD age related macular degeneration; POAG primary open angle glaucoma; EBMD epithelial/anterior basement membrane dystrophy; ACIOL anterior chamber intraocular lens; IOL intraocular lens; PCIOL posterior chamber intraocular lens; Phaco/IOL phacoemulsification with intraocular lens placement; PRK photorefractive keratectomy; LASIK laser assisted in situ keratomileusis; HTN hypertension; DM diabetes mellitus; COPD chronic obstructive pulmonary disease

## 2020-06-17 NOTE — Assessment & Plan Note (Signed)
The nature of the vitreous hemorrhage was discussed with the patient as well as the common causes.   Patients with diabetic eye disease may develop retinal neovascularization.  Other eye conditions develop retinal  neovascularization secondary to retinal venous occlusions.   Vitreous hemorrhage may result from spontaneous vitreous detachment or retinal breaks.  Blunt trauma is a common cause as well.  The need for serial evaluation of the fundus (interior of the eye) until  clear views are obtained was addressed. An occasional need  to monitor the condition by in office  B-scan ultrasonography in the case of dense vitreous hemorrhage was discussed. 

## 2020-06-17 NOTE — Assessment & Plan Note (Signed)
New preretinal vitreous hemorrhage mild, now with no symptoms nonetheless seen on clinical examination boat shaped hemorrhage inferior to the macula likely related to recent PVD with tractional changes from previous neovascular tissue from the nerve.  Nonetheless there is room in this type 1 diabetes mellitus patient for peripheral anterior PRP simply to prevent retinal nonperfusion and vegF production triggering further progression of disease  PRP anteriorly delivered today

## 2020-08-05 ENCOUNTER — Ambulatory Visit: Payer: Medicare Other | Admitting: Internal Medicine

## 2020-08-26 ENCOUNTER — Encounter (INDEPENDENT_AMBULATORY_CARE_PROVIDER_SITE_OTHER): Payer: Medicare Other | Admitting: Ophthalmology

## 2020-09-02 ENCOUNTER — Other Ambulatory Visit: Payer: Self-pay

## 2020-09-02 ENCOUNTER — Encounter: Payer: Self-pay | Admitting: *Deleted

## 2020-09-02 ENCOUNTER — Ambulatory Visit (INDEPENDENT_AMBULATORY_CARE_PROVIDER_SITE_OTHER): Payer: Medicare Other | Admitting: Ophthalmology

## 2020-09-02 ENCOUNTER — Encounter: Payer: Self-pay | Admitting: Internal Medicine

## 2020-09-02 ENCOUNTER — Ambulatory Visit (INDEPENDENT_AMBULATORY_CARE_PROVIDER_SITE_OTHER): Payer: Medicare Other | Admitting: Internal Medicine

## 2020-09-02 ENCOUNTER — Encounter (INDEPENDENT_AMBULATORY_CARE_PROVIDER_SITE_OTHER): Payer: Self-pay | Admitting: Ophthalmology

## 2020-09-02 VITALS — BP 124/65 | HR 82 | Ht 65.0 in | Wt 142.3 lb

## 2020-09-02 DIAGNOSIS — I10 Essential (primary) hypertension: Secondary | ICD-10-CM

## 2020-09-02 DIAGNOSIS — E103591 Type 1 diabetes mellitus with proliferative diabetic retinopathy without macular edema, right eye: Secondary | ICD-10-CM | POA: Diagnosis not present

## 2020-09-02 DIAGNOSIS — M19041 Primary osteoarthritis, right hand: Secondary | ICD-10-CM

## 2020-09-02 DIAGNOSIS — H4312 Vitreous hemorrhage, left eye: Secondary | ICD-10-CM

## 2020-09-02 DIAGNOSIS — J301 Allergic rhinitis due to pollen: Secondary | ICD-10-CM | POA: Diagnosis not present

## 2020-09-02 DIAGNOSIS — H35043 Retinal micro-aneurysms, unspecified, bilateral: Secondary | ICD-10-CM | POA: Diagnosis not present

## 2020-09-02 DIAGNOSIS — E103592 Type 1 diabetes mellitus with proliferative diabetic retinopathy without macular edema, left eye: Secondary | ICD-10-CM | POA: Diagnosis not present

## 2020-09-02 NOTE — Assessment & Plan Note (Signed)
Take Claritin 5 mg as needed °

## 2020-09-02 NOTE — Assessment & Plan Note (Signed)
Stable patient does not complain of any pain

## 2020-09-02 NOTE — Assessment & Plan Note (Signed)
Stable at the present time. 

## 2020-09-02 NOTE — Assessment & Plan Note (Signed)

## 2020-09-02 NOTE — Progress Notes (Signed)
Established Patient Office Visit  Subjective:  Patient ID: Gail Franco, female    DOB: 04-30-1955  Age: 65 y.o. MRN: [id number]  CC:  Chief Complaint  Patient presents with   Follow-up      Gail Franco presents for general checkup.  She denies any chest pain or shortness of breath.  Her hemoglobin AIC is under control.  He has recently seen an eye doctor.  We will get a dilated eye exam.  He does not smoke does not drink.  He goes to [facility] endocrine on a regular basis.  Past Medical History:  Diagnosis Date   Arthritis    Diabetes mellitus without complication (HCC)    Type 1   Diffuse cystic mastopathy    Myocardial infarction (HCC)    PONV (postoperative nausea and vomiting)    always gets nauseated, needs Zofran   Thyroid disease 2008   no longer on medications    Past Surgical History:  Procedure Laterality Date   ABDOMINAL HYSTERECTOMY  1992   APPENDECTOMY     BREAST BIOPSY  1992   CARDIAC CATHETERIZATION  2010   CARPAL TUNNEL RELEASE     COLONOSCOPY  2010   CORONARY ARTERY BYPASS GRAFT  1995   EYE SURGERY Left    cataract with lens implant   EYE SURGERY Right 2018   cataract with lens implant   lumbar fusions  2010   LUMBAR WOUND DEBRIDEMENT N/A 02/09/2017   Procedure: LUMBAR WOUND REVISION WITH REMOVAL OF PALTE;  Surgeon: Tia Alert, MD;  Location: [facility];  Service: Neurosurgery;  Laterality: N/A;   OTHER SURGICAL HISTORY  2011   scar revision d/t fistula lumbar fusion   SHOULDER SURGERY     UPPER GI ENDOSCOPY  2010, 2011    Family History  Problem Relation Age of Onset   Atrial fibrillation Mother    Heart disease Father     Social History   Socioeconomic History   Marital status: Married    Spouse name: Not on file   Number of children: Not on file   Years of education: Not on file   Highest education level: Not on file  Occupational History   Not on file  Tobacco Use   Smoking status: Never   Smokeless tobacco: Never   Vaping Use   Vaping Use: Never used  Substance and Sexual Activity   Alcohol use: No   Drug use: No   Sexual activity: Not on file  Other Topics Concern   Not on file  Social History Narrative   Not on file   Social Determinants of Health   Financial Resource Strain: Not on file  Food Insecurity: Not on file  Transportation Needs: Not on file  Physical Activity: Not on file  Stress: Not on file  Social Connections: Not on file  Intimate Partner Violence: Not on file     Current Outpatient Medications:    acetaminophen (TYLENOL) 500 MG tablet, Take 500 mg by mouth every 6 (six) hours as needed for pain., Disp: , Rfl:    carvedilol (COREG) 6.25 MG tablet, Take 1 tablet (6.25 mg total) by mouth 2 (two) times daily with a meal., Disp: 180 tablet, Rfl: 3.   clopidogrel (PLAVIX) 75 MG tablet, Take 1 tablet (75 mg total) by mouth daily., Disp: 90 tablet, Rfl: 3   cyclobenzaprine (FLEXERIL) 10 MG tablet, Take 1 tablet (10 mg total) by mouth at bedtime., Disp: 90 tablet, Rfl: 3  FLUoxetine (PROZAC) 20 MG capsule, Take 1 capsule (20 mg total) by mouth 2 (two) times daily., Disp: 180 capsule, Rfl: 3   Glucagon (BAQSIMI ONE PACK) 3 MG/DOSE POWD, 3 mg by Left Nare route once as needed for up to 1 dose. For severe hypoglycemia with impaired consciousness, Disp: , Rfl:    HUMALOG 100 UNIT/ML injection, Inject into the skin 3 (three) times daily with meals. Per sliding scale, Disp: , Rfl:    insulin degludec (TRESIBA FLEXTOUCH) 100 UNIT/ML FlexTouch Pen, Inject into the skin., Disp: , Rfl:    lisinopril (ZESTRIL) 2.5 MG tablet, Take 1 tablet (2.5 mg total) by mouth at bedtime., Disp: 90 tablet, Rfl: 3   pantoprazole (PROTONIX) 40 MG tablet, TAKE 1 TABLET BY MOUTH DAILY, Disp: 90 tablet, Rfl: 3   rosuvastatin (CRESTOR) 10 MG tablet, Take 1 tablet (10 mg total) by mouth at bedtime., Disp: 90 tablet, Rfl: 3   topiramate (TOPAMAX) 50 MG tablet, Take 1 tablet (50 mg total) by mouth 2 (two) times  daily., Disp: 180 tablet, Rfl: 3   Allergies  Allergen Reactions   Other Itching    Steri strips     ROS Review of Systems  Constitutional: Negative.   HENT: Negative.    Eyes: Negative.   Respiratory: Negative.    Cardiovascular: Negative.   Gastrointestinal: Negative.   Endocrine: Negative.   Genitourinary: Negative.   Musculoskeletal: Negative.   Skin: Negative.   Allergic/Immunologic: Negative.   Neurological: Negative.   Hematological: Negative.   Psychiatric/Behavioral: Negative.    All other systems reviewed and are negative.    Objective:    Physical Exam Vitals reviewed.  Constitutional:      Appearance: Normal appearance.  HENT:     Mouth/Throat:     Mouth: Mucous membranes are moist.  Eyes:     Pupils: Pupils are equal, round, and reactive to light.  Neck:     Vascular: No carotid bruit.  Cardiovascular:     Rate and Rhythm: Normal rate and regular rhythm.     Pulses: Normal pulses.     Heart sounds: Normal heart sounds.  Pulmonary:     Effort: Pulmonary effort is normal.     Breath sounds: Normal breath sounds.  Abdominal:     General: Bowel sounds are normal.     Palpations: Abdomen is soft. There is no hepatomegaly, splenomegaly or mass.     Tenderness: There is no abdominal tenderness.     Hernia: No hernia is present.  Musculoskeletal:        General: No tenderness.     Cervical back: Neck supple.     Right lower leg: No edema.     Left lower leg: No edema.  Skin:    Findings: No rash.  Neurological:     Mental Status: She is alert and oriented to person, place, and time.     Motor: No weakness.  Psychiatric:        Mood and Affect: Mood and affect normal.        Behavior: Behavior normal.    BP 124/65   Pulse 82   Ht 5\' 5"  (1.651 m)   Wt 142 lb 4.8 oz (64.5 kg)   BMI 23.68 kg/m  Wt Readings from Last 3 Encounters:  09/02/20 142 lb 4.8 oz (64.5 kg)  03/25/20 143 lb 8 oz (65.1 kg)  12/11/19 142 lb 4.8 oz (64.5 kg)      Health Maintenance Due  Topic Date Due  FOOT EXAM  Never done   Hepatitis C Screening  Never done   Zoster Vaccines- Shingrix (1 of 2) Never done   COLONOSCOPY (Pts 45-17yrs Insurance coverage will need to be confirmed)  Never done   HEMOGLOBIN A1C  08/09/2017   DEXA SCAN  Never done   PNA vac Low Risk Adult (1 of 2 - PCV13) 10/27/2018   COVID-19 Vaccine (3 - Pfizer risk series) 04/06/2019   INFLUENZA VACCINE  08/24/2020    There are no preventive care reminders to display for this patient.  No results found for: TSH Lab Results  Component Value Date   WBC 6.9 02/09/2017   HGB 13.1 02/09/2017   HCT 40.6 02/09/2017   MCV 96.7 02/09/2017   PLT 272 02/09/2017   Lab Results  Component Value Date   NA 139 02/09/2017   K 4.9 02/09/2017   CO2 18 (L) 02/09/2017   GLUCOSE 118 (H) 02/09/2017   BUN 25 (H) 02/09/2017   CREATININE 1.03 (H) 02/09/2017   CALCIUM 9.9 02/09/2017   ANIONGAP 13 02/09/2017   No results found for: CHOL No results found for: HDL No results found for: LDLCALC No results found for: TRIG No results found for: CHOLHDL Lab Results  Component Value Date   HGBA1C 6.8 (H) 02/09/2017      Assessment & Plan:   Problem List Items Addressed This Visit       Cardiovascular and Mediastinum   Retinal microaneurysm of both eyes - Primary    Stable at the present time       Essential hypertension     Patient denies any chest pain or shortness of breath there is no history of palpitation or paroxysmal nocturnal dyspnea   patient was advised to follow low-salt low-cholesterol diet    ideally I want to keep systolic blood pressure below 443 mmHg, patient was asked to check blood pressure one times a week and give me a report on that.  Patient will be follow-up in 3 months  or earlier as needed, patient will call me back for any change in the cardiovascular symptoms Patient was advised to buy a book from local bookstore concerning blood pressure and read  several chapters  every day.  This will be supplemented by some of the material we will give him from the office.  Patient should also utilize other resources like YouTube and Internet to learn more about the blood pressure and the diet.         Respiratory   Seasonal allergic rhinitis due to pollen    Take Claritin 5 mg as needed         Endocrine   Diabetic maculopathy of right eye with proliferative retinopathy determined by examination associated with type 1 diabetes mellitus (HCC)     Musculoskeletal and Integument   Arthritis of finger of right hand    Stable patient does not complain of any pain        No orders of the defined types were placed in this encounter.   Follow-up: No follow-ups on file.    Corky Downs, MD

## 2020-09-02 NOTE — Assessment & Plan Note (Signed)
Much less active than smaller vitreous hemorrhage OS post recent PRP.  We will continue to observe

## 2020-09-02 NOTE — Assessment & Plan Note (Signed)
Smaller inferior, clearing

## 2020-09-02 NOTE — Progress Notes (Signed)
09/02/2020     CHIEF COMPLAINT Patient presents for Retina Follow Up (11 week fu OU and FP/Pt states, "My vision is great and I have not had any issues what so ever."/A1C: 6.9/LBS: 105/)   HISTORY OF PRESENT ILLNESS: Gail Franco is a 65 y.o. female who presents to the clinic today for:   HPI     Retina Follow Up           Diagnosis: Diabetic Retinopathy   Laterality: both eyes   Onset: 11 weeks ago   Severity: mild   Duration: 11 weeks   Course: stable   Comments: 11 week fu OU and FP Pt states, "My vision is great and I have not had any issues what so ever." A1C: 6.9 LBS: 105        Last edited by Demetrios LollBusick, Erica L, COA on 09/02/2020  8:28 AM.      Referring physician: Corky DownsMasoud, Javed, MD [address] [place] [id number]  HISTORICAL INFORMATION:   Selected notes from the MEDICAL RECORD NUMBER    Lab Results  Component Value Date   HGBA1C 6.8 (H) 02/09/2017     CURRENT MEDICATIONS: No current outpatient medications on file. (Ophthalmic Drugs)   No current facility-administered medications for this visit. (Ophthalmic Drugs)   Current Outpatient Medications (Other)  Medication Sig   acetaminophen (TYLENOL) 500 MG tablet Take 500 mg by mouth every 6 (six) hours as needed for pain.   Aspirin Buf,CaCarb-MgCarb-MgO, 81 MG TABS Take by mouth.   carvedilol (COREG) 6.25 MG tablet Take 1 tablet (6.25 mg total) by mouth 2 (two) times daily with a meal.   clopidogrel (PLAVIX) 75 MG tablet Take 1 tablet (75 mg total) by mouth daily.   cyclobenzaprine (FLEXERIL) 10 MG tablet Take 1 tablet (10 mg total) by mouth at bedtime.   FLUoxetine (PROZAC) 20 MG capsule Take 1 capsule (20 mg total) by mouth 2 (two) times daily.   gabapentin (NEURONTIN) 100 MG capsule Take 100 mg by mouth 3 (three) times daily.   Glucagon (BAQSIMI ONE PACK) 3 MG/DOSE POWD 3 mg by Left Nare route once as needed for up to 1 dose. For severe hypoglycemia with impaired consciousness   HUMALOG 100  UNIT/ML injection Inject into the skin 3 (three) times daily with meals. Per sliding scale   insulin degludec (TRESIBA FLEXTOUCH) 100 UNIT/ML FlexTouch Pen Inject into the skin.   LANTUS 100 UNIT/ML injection Inject 4-12 Units into the skin See admin instructions. 4 units in the morning, 8-12 units in the evening   lisinopril (ZESTRIL) 2.5 MG tablet Take 1 tablet (2.5 mg total) by mouth at bedtime.   oxyCODONE-acetaminophen (PERCOCET) 7.5-325 MG tablet Take 1 tablet by mouth every 6 (six) hours as needed for severe pain.   pantoprazole (PROTONIX) 40 MG tablet TAKE 1 TABLET BY MOUTH DAILY   rosuvastatin (CRESTOR) 10 MG tablet Take 1 tablet (10 mg total) by mouth at bedtime.   topiramate (TOPAMAX) 50 MG tablet Take 1 tablet (50 mg total) by mouth 2 (two) times daily.   No current facility-administered medications for this visit. (Other)      REVIEW OF SYSTEMS:    ALLERGIES Allergies  Allergen Reactions   Other Itching    Steri strips     PAST MEDICAL HISTORY Past Medical History:  Diagnosis Date   Arthritis    Diabetes mellitus without complication (HCC)    Type 1   Diffuse cystic mastopathy    Myocardial  infarction (HCC)    PONV (postoperative nausea and vomiting)    always gets nauseated, needs Zofran   Thyroid disease 2008   no longer on medications   Past Surgical History:  Procedure Laterality Date   ABDOMINAL HYSTERECTOMY  1992   APPENDECTOMY     BREAST BIOPSY  1992   CARDIAC CATHETERIZATION  2010   CARPAL TUNNEL RELEASE     COLONOSCOPY  2010   CORONARY ARTERY BYPASS GRAFT  1995   EYE SURGERY Left    cataract with lens implant   EYE SURGERY Right 2018   cataract with lens implant   lumbar fusions  2010   LUMBAR WOUND DEBRIDEMENT N/A 02/09/2017   Procedure: LUMBAR WOUND REVISION WITH REMOVAL OF PALTE;  Surgeon: Tia Alert, MD;  Location: [facility];  Service: Neurosurgery;  Laterality: N/A;   OTHER SURGICAL HISTORY  2011   scar revision d/t fistula lumbar  fusion   SHOULDER SURGERY     UPPER GI ENDOSCOPY  2010, 2011    FAMILY HISTORY Family History  Problem Relation Age of Onset   Atrial fibrillation Mother    Heart disease Father     SOCIAL HISTORY Social History   Tobacco Use   Smoking status: Never   Smokeless tobacco: Never  Vaping Use   Vaping Use: Never used  Substance Use Topics   Alcohol use: No   Drug use: No         OPHTHALMIC EXAM:  Base Eye Exam     Visual Acuity (ETDRS)       Right Left   Dist [place] 20/40 -1 20/50   Dist ph [place] 20/30 20/30 +1         Tonometry (Tonopen, 8:32 AM)       Right Left   Pressure 14 12         Pupils       Pupils Dark Light Shape React APD   Right PERRL 4 3 Round Brisk None   Left PERRL 4 3 Round Brisk None         Visual Fields (Counting fingers)       Left Right    Full Full         Extraocular Movement       Right Left    Full Full         Neuro/Psych     Oriented x3: Yes   Mood/Affect: Normal         Dilation     Both eyes: 1.0% Mydriacyl, 2.5% Phenylephrine @ 8:33 AM           Slit Lamp and Fundus Exam     External Exam       Right Left   External Normal Normal         Slit Lamp Exam       Right Left   Lids/Lashes Normal Normal   Conjunctiva/Sclera White and quiet White and quiet   Cornea Clear Clear   Anterior Chamber Deep and quiet Deep and quiet   Iris Round and reactive Round and reactive   Lens Posterior chamber intraocular lens Posterior chamber intraocular lens   Anterior Vitreous Posterior vitreous detachment Posterior vitreous detachment         Fundus Exam       Right Left   Posterior Vitreous Posterior vitreous detachment Posterior vitreous detachment, Pre-retinal hemorrhage boat shaped hemorrhage inferior to macula.,  Much smaller than near the equator inferiorly to  Disc Normal Normal   C/D Ratio 0.4 0.4   Macula Microaneurysms Microaneurysms   Vessels PDR-quiet  diabetic retinopathy OS PDR,  active with preretinal vitreous hemorrhage   Periphery Diabetic retinopathy, quiet, good PRP Diabetic retinopathy, active with preretinal hemorrhage inferiorly, room anterior for more laser            IMAGING AND PROCEDURES  Imaging and Procedures for 09/02/20  Color Fundus Photography Optos - OU - Both Eyes       Right Eye Progression has been stable. Disc findings include normal observations. Macula : normal observations, microaneurysms.   Left Eye Progression has worsened. Disc findings include normal observations. Macula : microaneurysms.   Notes Quiescent PDR OD, clear media good PRP  OS with smaller preretinal boat shaped hemorrhage inferiorly near the equator with good PRP now anterior nasally and inferiorly             ASSESSMENT/PLAN:  Diabetic maculopathy of right eye with proliferative retinopathy determined by examination associated with type 1 diabetes mellitus (HCC) PDR quiescent OD  Proliferative diabetic retinopathy of left eye determined by examination associated with type 1 diabetes mellitus (HCC) Much less active than smaller vitreous hemorrhage OS post recent PRP.  We will continue to observe  Vitreous hemorrhage, left eye (HCC) Smaller inferior, clearing     ICD-10-CM   1. Diabetic maculopathy of right eye with proliferative retinopathy determined by examination associated with type 1 diabetes mellitus (HCC)  E10.3591 Color Fundus Photography Optos - OU - Both Eyes    2. Proliferative diabetic retinopathy of left eye determined by examination associated with type 1 diabetes mellitus (HCC)  W23.7628 Color Fundus Photography Optos - OU - Both Eyes    3. Vitreous hemorrhage, left eye (HCC)  H43.12       1.  OS, vastly improved after recent PRP for recent small vitreous hemorrhage.  Hemorrhage has moved inferiorly and is clearing.  No new hemorrhage has occurred.  There is a small rim anteriorly temporally should further neovascularization be  identified in the future.  None present at this time.  2.  OD also clearing, and inactive PDR  3.  Ophthalmic Meds Ordered this visit:  No orders of the defined types were placed in this encounter.      Return in about 7 months (around 04/02/2021) for DILATE OU, COLOR FP.  There are no Patient Instructions on file for this visit.   Explained the diagnoses, plan, and follow up with the patient and they expressed understanding.  Patient expressed understanding of the importance of proper follow up care.   Alford Highland Veta Dambrosia M.D. Diseases & Surgery of the Retina and Vitreous [facility] 09/02/20     Abbreviations: M myopia (nearsighted); A astigmatism; H hyperopia (farsighted); P presbyopia; Mrx spectacle prescription;  CTL contact lenses; OD right eye; OS left eye; OU both eyes  XT exotropia; ET esotropia; PEK punctate epithelial keratitis; PEE punctate epithelial erosions; DES dry eye syndrome; MGD meibomian gland dysfunction; ATs artificial tears; PFAT's preservative free artificial tears; NSC nuclear sclerotic cataract; PSC posterior subcapsular cataract; ERM epi-retinal membrane; PVD posterior vitreous detachment; RD retinal detachment; DM diabetes mellitus; DR diabetic retinopathy; NPDR non-proliferative diabetic retinopathy; PDR proliferative diabetic retinopathy; CSME clinically significant macular edema; DME diabetic macular edema; dbh dot blot hemorrhages; CWS cotton wool spot; POAG primary open angle glaucoma; C/D cup-to-disc ratio; HVF humphrey visual field; GVF goldmann visual field; OCT optical coherence tomography; IOP intraocular pressure; BRVO Branch retinal vein  occlusion; CRVO central retinal vein occlusion; CRAO central retinal artery occlusion; BRAO branch retinal artery occlusion; RT retinal tear; SB scleral buckle; PPV pars plana vitrectomy; VH Vitreous hemorrhage; PRP panretinal laser photocoagulation; IVK intravitreal kenalog; VMT vitreomacular traction;  MH Macular hole;  NVD neovascularization of the disc; NVE neovascularization elsewhere; AREDS age related eye disease study; ARMD age related macular degeneration; POAG primary open angle glaucoma; EBMD epithelial/anterior basement membrane dystrophy; ACIOL anterior chamber intraocular lens; IOL intraocular lens; PCIOL posterior chamber intraocular lens; Phaco/IOL phacoemulsification with intraocular lens placement; PRK photorefractive keratectomy; LASIK laser assisted in situ keratomileusis; HTN hypertension; DM diabetes mellitus; COPD chronic obstructive pulmonary disease

## 2020-09-02 NOTE — Assessment & Plan Note (Signed)
PDR quiescent OD ?

## 2020-11-02 ENCOUNTER — Encounter (INDEPENDENT_AMBULATORY_CARE_PROVIDER_SITE_OTHER): Payer: Medicare Other | Admitting: Ophthalmology

## 2020-11-04 ENCOUNTER — Encounter (INDEPENDENT_AMBULATORY_CARE_PROVIDER_SITE_OTHER): Payer: Self-pay

## 2020-11-04 ENCOUNTER — Encounter (INDEPENDENT_AMBULATORY_CARE_PROVIDER_SITE_OTHER): Payer: Medicare Other | Admitting: Ophthalmology

## 2020-11-09 ENCOUNTER — Other Ambulatory Visit: Payer: Self-pay | Admitting: *Deleted

## 2020-11-09 MED ORDER — CARVEDILOL 6.25 MG PO TABS: 6.2500 mg | ORAL_TABLET | Freq: Two times a day (BID) | ORAL | Status: DC

## 2020-11-09 MED ORDER — PANTOPRAZOLE SODIUM 40 MG PO TBEC: 40.0000 mg | DELAYED_RELEASE_TABLET | Freq: Every day | ORAL | 3 refills | Status: DC

## 2020-11-09 MED ORDER — CARVEDILOL 6.25 MG PO TABS: 6.2500 mg | ORAL_TABLET | Freq: Two times a day (BID) | ORAL | 3 refills | Status: DC

## 2020-11-10 ENCOUNTER — Other Ambulatory Visit: Payer: Self-pay | Admitting: *Deleted

## 2020-11-10 MED ORDER — CARVEDILOL 6.25 MG PO TABS
6.2500 mg | ORAL_TABLET | Freq: Two times a day (BID) | ORAL | 3 refills | Status: DC
Start: 2020-11-10 — End: 2021-08-04

## 2020-11-10 MED ORDER — PANTOPRAZOLE SODIUM 40 MG PO TBEC: 40.0000 mg | DELAYED_RELEASE_TABLET | Freq: Every day | ORAL | 3 refills | Status: DC

## 2021-01-06 ENCOUNTER — Ambulatory Visit (INDEPENDENT_AMBULATORY_CARE_PROVIDER_SITE_OTHER): Payer: Medicare Other | Admitting: *Deleted

## 2021-01-06 ENCOUNTER — Ambulatory Visit: Payer: Medicare Other | Admitting: Internal Medicine

## 2021-01-06 DIAGNOSIS — Z0000 Encounter for general adult medical examination without abnormal findings: Secondary | ICD-10-CM

## 2021-01-06 NOTE — Progress Notes (Signed)
Subjective:   Gail Franco is a 65 y.o. female who presents for Medicare Annual (Subsequent) preventive examination. I discussed the limitations of evaluation and management by telemedicine and the availability of in person appointments. Patient expressed understanding and agreed to proceed.   Visit performed using audio  Patient:home Provider:home   Review of Systems    Defer to provider Cardiac Risk Factors include: diabetes mellitus;advanced age (>44men, >61 women);hypertension     Objective:    There were no vitals filed for this visit. There is no height or weight on file to calculate BMI.  Advanced Directives 01/06/2021 02/09/2017  Does Patient Have a Medical Advance Directive? No No  Would patient like information on creating a medical advance directive? No - Patient declined No - Patient declined    Current Medications (verified) Outpatient Encounter Medications as of 01/06/2021  Medication Sig   acetaminophen (TYLENOL) 500 MG tablet Take 500 mg by mouth every 6 (six) hours as needed for pain.   carvedilol (COREG) 6.25 MG tablet Take 1 tablet (6.25 mg total) by mouth 2 (two) times daily with a meal.   clopidogrel (PLAVIX) 75 MG tablet Take 1 tablet (75 mg total) by mouth daily.   cyclobenzaprine (FLEXERIL) 10 MG tablet Take 1 tablet (10 mg total) by mouth at bedtime.   FLUoxetine (PROZAC) 20 MG capsule Take 1 capsule (20 mg total) by mouth 2 (two) times daily.   Glucagon (BAQSIMI ONE PACK) 3 MG/DOSE POWD 3 mg by Left Nare route once as needed for up to 1 dose. For severe hypoglycemia with impaired consciousness   HUMALOG 100 UNIT/ML injection Inject into the skin 3 (three) times daily with meals. Per sliding scale   insulin degludec (TRESIBA FLEXTOUCH) 100 UNIT/ML FlexTouch Pen Inject into the skin.   lisinopril (ZESTRIL) 2.5 MG tablet Take 1 tablet (2.5 mg total) by mouth at bedtime.   pantoprazole (PROTONIX) 40 MG tablet Take 1 tablet (40 mg total) by mouth daily.    rosuvastatin (CRESTOR) 10 MG tablet Take 1 tablet (10 mg total) by mouth at bedtime.   topiramate (TOPAMAX) 50 MG tablet Take 1 tablet (50 mg total) by mouth 2 (two) times daily.   No facility-administered encounter medications on file as of 01/06/2021.    Allergies (verified) Other   History: Past Medical History:  Diagnosis Date   Arthritis    Diabetes mellitus without complication (HCC)    Type 1   Diffuse cystic mastopathy    Myocardial infarction ([place])    PONV (postoperative nausea and vomiting)    always gets nauseated, needs Zofran   Thyroid disease 2008   no longer on medications   Past Surgical History:  Procedure Laterality Date   ABDOMINAL HYSTERECTOMY  [place]  2010   CARPAL TUNNEL RELEASE     COLONOSCOPY  2010   CORONARY ARTERY BYPASS GRAFT  1995   EYE SURGERY Left    cataract with lens implant   EYE SURGERY Right 2018   cataract with lens implant   lumbar fusions  2010   LUMBAR WOUND DEBRIDEMENT N/A 02/09/2017   Procedure: [place];  Surgeon: Eustace Moore, MD;  Location: [facility];  Service: Neurosurgery;  Laterality: N/A;   OTHER SURGICAL HISTORY  2011   scar revision d/t fistula lumbar fusion   SHOULDER SURGERY     UPPER GI ENDOSCOPY  2010, 2011  Family History  Problem Relation Age of Onset   Atrial fibrillation Mother    Heart disease Father    Social History   Socioeconomic History   Marital status: Married    Spouse name: Not on file   Number of children: Not on file   Years of education: Not on file   Highest education level: Not on file  Occupational History   Not on file  Tobacco Use   Smoking status: Never   Smokeless tobacco: Never  Vaping Use   Vaping Use: Never used  Substance and Sexual Activity   Alcohol use: No   Drug use: No   Sexual activity: Not on file  Other Topics Concern   Not on file  Social History Narrative    Not on file   Social Determinants of Health   Financial Resource Strain: Low Risk    Difficulty of Paying Living Expenses: Not hard at all  Food Insecurity: No Food Insecurity   Worried About Programme researcher, broadcasting/film/video in the Last Year: Never true   Ran Out of Food in the Last Year: Never true  Transportation Needs: No Transportation Needs   Lack of Transportation (Medical): No   Lack of Transportation (Non-Medical): No  Physical Activity: Sufficiently Active   Days of Exercise per Week: 7 days   Minutes of Exercise per Session: 60 min  Stress: No Stress Concern Present   Feeling of Stress : Not at all  Social Connections: Socially Integrated   Frequency of Communication with Friends and Family: More than three times a week   Frequency of Social Gatherings with Friends and Family: More than three times a week   Attends Religious Services: More than 4 times per year   Active Member of [employer] or Organizations: No   Attends Engineer, structural: More than 4 times per year   Marital Status: Married    Tobacco Counseling Counseling given: Not Answered   Clinical Intake:  Pre-visit preparation completed: Yes  Pain : No/denies pain     Nutritional Risks: None Diabetes: Yes CBG done?: No Did pt. bring in CBG monitor from home?: No  How often do you need to have someone help you when you read instructions, pamphlets, or other written materials from your doctor or pharmacy?: 1 - Never What is the last grade level you completed in school?: completed high school and college  Diabetic?yes  Interpreter Needed?: No  Information entered by :: Melody Comas, CMA   Activities of Daily Living In your present state of health, do you have any difficulty performing the following activities: 01/06/2021  Hearing? N  Vision? N  Difficulty concentrating or making decisions? N  Walking or climbing stairs? N  Dressing or bathing? N  Doing errands, shopping? N  Preparing Food and  eating ? N  Using the Toilet? N  In the past six months, have you accidently leaked urine? N  Do you have problems with loss of bowel control? N  Managing your Medications? N  Managing your Finances? N  Housekeeping or managing your Housekeeping? N  Some recent data might be hidden    Patient Care Team: Corky Downs, MD as PCP - General (Unknown Physician Specialty) Kieth Brightly, MD (General Surgery) Corky Downs, MD (Internal Medicine)  Indicate any recent Medical Services you may have received from other than [facility] providers in the past year (date may be approximate).     Assessment:   This is a routine wellness examination  for [place].  Hearing/Vision screen No results found.  Dietary issues and exercise activities discussed: Current Exercise Habits: Home exercise routine, Type of exercise: treadmill, Time (Minutes): 60, Frequency (Times/Week): 7, Weekly Exercise (Minutes/Week): 420, Intensity: Mild, Exercise limited by: None identified   Goals Addressed   None    Depression Screen PHQ 2/9 Scores 01/06/2021 01/06/2021 09/02/2020 12/11/2019  PHQ - 2 Score 0 0 1 0    Fall Risk Fall Risk  01/06/2021 09/02/2020 12/11/2019  Falls in the past year? 0 0 0  Number falls in past yr: 0 0 0  Injury with Fall? 0 0 0  Risk for fall due to : No Fall Risks No Fall Risks -  Follow up Falls evaluation completed Falls evaluation completed -    FALL RISK PREVENTION PERTAINING TO THE HOME:  Any stairs in or around the home? No  If so, are there any without handrails? No  Home free of loose throw rugs in walkways, pet beds, electrical cords, etc? Yes  Adequate lighting in your home to reduce risk of falls? Yes   ASSISTIVE DEVICES UTILIZED TO PREVENT FALLS:  Life alert? No  Use of a cane, walker or w/c? No  Grab bars in the bathroom? No  Shower chair or bench in shower? No  Elevated toilet seat or a handicapped toilet? No   TIMED UP AND GO:  Was the test performed? No  .  Length of time to ambulate -NA  Gait steady and fast without use of assistive device  Cognitive Function: MMSE - Mini Mental State Exam 01/06/2021 01/06/2021  Not completed: Unable to complete Unable to complete     6CIT Screen 01/06/2021  What Year? 0 points  What month? 0 points  What time? 0 points  Count back from 20 0 points  Months in reverse 0 points  Repeat phrase 0 points  Total Score 0    Immunizations Immunization History  Administered Date(s) Administered   Fluad Quad(high Dose 65+) 12/11/2019   Influenza,inj,Quad PF,6+ Mos 12/09/2011, 12/13/2013   Influenza-Unspecified 11/25/2002, 10/09/2014, 10/25/2015, 10/24/2017, 10/24/2020   Meningococcal B, Unspecified 11/24/2012   PFIZER(Purple Top)SARS-COV-2 Vaccination 02/16/2019, 03/09/2019   Pneumococcal Polysaccharide-23 01/25/2000, 10/13/2014   Td 01/14/2015   Tdap 01/19/2011, 01/14/2015    TDAP status: Up to date  Flu Vaccine status: Up to date  Pneumococcal vaccine status: Declined,  Education has been provided regarding the importance of this vaccine but patient still declined. Advised may receive this vaccine at local pharmacy or Health Dept. Aware to provide a copy of the vaccination record if obtained from local pharmacy or Health Dept. Verbalized acceptance and understanding.   Covid-19 vaccine status: Completed vaccines  Qualifies for Shingles Vaccine? Yes   Zostavax completed No   Shingrix Completed?: No.    Education has been provided regarding the importance of this vaccine. Patient has been advised to call insurance company to determine out of pocket expense if they have not yet received this vaccine. Advised may also receive vaccine at local pharmacy or Health Dept. Verbalized acceptance and understanding.  Screening Tests Health Maintenance  Topic Date Due   Hepatitis C Screening  Never done   Pneumonia Vaccine 69+ Years old (2 - PCV) 10/13/2015   COVID-19 Vaccine (3 - Booster for [place]  series) 01/22/2021 (Originally 05/04/2019)   Zoster Vaccines- Shingrix (1 of 2) 04/06/2021 (Originally 10/27/2003)   DEXA SCAN  01/06/2022 (Originally 10/27/2018)   COLONOSCOPY (Pts 45-93yrs Insurance coverage will need to be confirmed)  01/06/2022 (  Originally 10/27/1998)   HEMOGLOBIN A1C  04/24/2021   OPHTHALMOLOGY EXAM  06/17/2021   FOOT EXAM  07/24/2021   MAMMOGRAM  03/26/2022   TETANUS/TDAP  01/13/2025   INFLUENZA VACCINE  Completed   HPV VACCINES  Aged Out    Health Maintenance  Health Maintenance Due  Topic Date Due   Hepatitis C Screening  Never done   Pneumonia Vaccine 66+ Years old (2 - PCV) 10/13/2015    Patient declines colonoscopy   Mammogram status: Completed [id number]. Repeat every year    Lung Cancer Screening: (Low Dose CT Chest recommended if Age 79-80 years, 30 pack-year currently smoking OR have quit w/in 15years.) does not qualify.   Lung Cancer Screening Referral: NA  Additional Screening:  Hepatitis C Screening: does qualify; Completed   Vision Screening: Recommended annual ophthalmology exams for early detection of glaucoma and other disorders of the eye. Is the patient up to date with their annual eye exam?  Yes   Dental Screening: Recommended annual dental exams for proper oral hygiene  Community Resource Referral / Chronic Care Management: CRR required this visit?  No   CCM required this visit?  No      Plan:     I have personally reviewed and noted the following in the patients chart:   Medical and social history Use of alcohol, tobacco or illicit drugs  Current medications and supplements including opioid prescriptions.  Functional ability and status Nutritional status Physical activity Advanced directives List of other physicians Hospitalizations, surgeries, and ER visits in previous 12 months Vitals Screenings to include cognitive, depression, and falls Referrals and appointments  In addition, I have reviewed and discussed  with patient certain preventive protocols, quality metrics, and best practice recommendations. A written personalized care plan for preventive services as well as general preventive health recommendations were provided to patient.     Lacretia Nicks, [place]   01/06/2021   Nurse Notes:  Ms. Pecher , Thank you for taking time to come for your Medicare Wellness Visit. I appreciate your ongoing commitment to your health goals. Please review the following plan we discussed and let me know if I can assist you in the future.   These are the goals we discussed:  Goals   None     This is a list of the screening recommended for you and due dates:  Health Maintenance  Topic Date Due   Hepatitis C Screening: USPSTF Recommendation to screen - Ages 3-79 yo.  Never done   Pneumonia Vaccine (2 - PCV) 10/13/2015   COVID-19 Vaccine (3 - Booster for Pfizer series) 01/22/2021*   Zoster (Shingles) Vaccine (1 of 2) 04/06/2021*   DEXA scan (bone density measurement)  01/06/2022*   Colon Cancer Screening  01/06/2022*   Hemoglobin A1C  04/24/2021   Eye exam for diabetics  06/17/2021   Complete foot exam   07/24/2021   Mammogram  03/26/2022   Tetanus Vaccine  01/13/2025   Flu Shot  Completed   HPV Vaccine  Aged Out  *Topic was postponed. The date shown is not the original due date.       Time spent 40 min

## 2021-01-07 NOTE — Progress Notes (Signed)
I have reviewed this visit and agree with the documentation.   

## 2021-01-24 LAB — HEMOGLOBIN A1C: Hemoglobin A1C: 7.2

## 2021-01-27 ENCOUNTER — Other Ambulatory Visit: Payer: Self-pay | Admitting: Internal Medicine

## 2021-01-28 ENCOUNTER — Other Ambulatory Visit: Payer: Self-pay | Admitting: Internal Medicine

## 2021-02-01 ENCOUNTER — Other Ambulatory Visit: Payer: Self-pay | Admitting: *Deleted

## 2021-02-01 MED ORDER — FLUOXETINE HCL 20 MG PO CAPS: 20.0000 mg | ORAL_CAPSULE | Freq: Two times a day (BID) | ORAL | 3 refills | Status: DC

## 2021-02-03 ENCOUNTER — Ambulatory Visit (INDEPENDENT_AMBULATORY_CARE_PROVIDER_SITE_OTHER): Payer: Medicare Other | Admitting: Internal Medicine

## 2021-02-03 ENCOUNTER — Encounter: Payer: Self-pay | Admitting: Internal Medicine

## 2021-02-03 ENCOUNTER — Other Ambulatory Visit: Payer: Self-pay

## 2021-02-03 VITALS — BP 119/74 | HR 87 | Ht 65.0 in | Wt 145.0 lb

## 2021-02-03 DIAGNOSIS — E119 Type 2 diabetes mellitus without complications: Secondary | ICD-10-CM | POA: Insufficient documentation

## 2021-02-03 DIAGNOSIS — H35043 Retinal micro-aneurysms, unspecified, bilateral: Secondary | ICD-10-CM

## 2021-02-03 DIAGNOSIS — H43811 Vitreous degeneration, right eye: Secondary | ICD-10-CM

## 2021-02-03 DIAGNOSIS — M19041 Primary osteoarthritis, right hand: Secondary | ICD-10-CM

## 2021-02-03 DIAGNOSIS — I10 Essential (primary) hypertension: Secondary | ICD-10-CM

## 2021-02-03 LAB — GLUCOSE, POCT (MANUAL RESULT ENTRY): POC Glucose: 77 mg/dl (ref 70–99)

## 2021-02-03 NOTE — Progress Notes (Signed)
Established Patient Office Visit  Subjective:  Patient ID: Gail Franco Long, female    DOB: 01/15/1956  Age: 66 y.o. MRN: [id number]  CC:  Chief Complaint  Patient presents with   Follow-up    HPI  Gail Franco Schetter presents for diabetes checkup, she is known to have coronary artery disease with history of old inferior myocardial infarction, she does not smoke does not drink, blood pressure is under control  Past Medical History:  Diagnosis Date   Arthritis    Diabetes mellitus without complication (HCC)    Type 1   Diffuse cystic mastopathy    Myocardial infarction (HCC)    PONV (postoperative nausea and vomiting)    always gets nauseated, needs Zofran   Thyroid disease 2008   no longer on medications    Past Surgical History:  Procedure Laterality Date   ABDOMINAL HYSTERECTOMY  1992   APPENDECTOMY     BREAST BIOPSY  1992   CARDIAC CATHETERIZATION  2010   CARPAL TUNNEL RELEASE     COLONOSCOPY  2010   CORONARY ARTERY BYPASS GRAFT  1995   EYE SURGERY Left    cataract with lens implant   EYE SURGERY Right 2018   cataract with lens implant   lumbar fusions  2010   LUMBAR WOUND DEBRIDEMENT N/A 02/09/2017   Procedure: LUMBAR WOUND REVISION WITH REMOVAL OF PALTE;  Surgeon: Tia AlertJones, David S, MD;  [facility];  Service: Neurosurgery;  Laterality: N/A;   OTHER SURGICAL HISTORY  2011   scar revision d/t fistula lumbar fusion   SHOULDER SURGERY     UPPER GI ENDOSCOPY  2010, 2011    Family History  Problem Relation Age of Onset   Atrial fibrillation Mother    Heart disease Father     Social History   Socioeconomic History   Marital status: Married    Spouse name: Not on file   Number of children: Not on file   Years of education: Not on file   Highest education level: Not on file  Occupational History   Not on file  Tobacco Use   Smoking status: Never   Smokeless tobacco: Never  Vaping Use   Vaping Use: Never used  Substance and Sexual Activity   Alcohol use:  No   Drug use: No   Sexual activity: Not on file  Other Topics Concern   Not on file  Social History Narrative   Not on file   Social Determinants of Health   Financial Resource Strain: Low Risk    Difficulty of Paying Living Expenses: Not hard at all  Food Insecurity: No Food Insecurity   Worried About Programme researcher, broadcasting/film/videounning Out of Food in the Last Year: Never true   Ran Out of Food in the Last Year: Never true  Transportation Needs: No Transportation Needs   Lack of Transportation (Medical): No   Lack of Transportation (Non-Medical): No  Physical Activity: Sufficiently Active   Days of Exercise per Week: 7 days   Minutes of Exercise per Session: 60 min  Stress: No Stress Concern Present   Feeling of Stress : Not at all  Social Connections: Socially Integrated   Frequency of Communication with Friends and Family: More than three times a week   Frequency of Social Gatherings with Friends and Family: More than three times a week   Attends Religious Services: More than 4 times per year   Active Member of [employer] or Organizations: No   Attends BankerClub or Organization Meetings:  More than 4 times per year   Marital Status: Married  Catering manager Violence: Not At Risk   Fear of Current or Ex-Partner: No   Emotionally Abused: No   Physically Abused: No   Sexually Abused: No     Current Outpatient Medications:    acetaminophen (TYLENOL) 500 MG tablet, Take 500 mg by mouth every 6 (six) hours as needed for pain., Disp: , Rfl:    carvedilol (COREG) 6.25 MG tablet, Take 1 tablet (6.25 mg total) by mouth 2 (two) times daily with a meal., Disp: 180 tablet, Rfl: 3   clopidogrel (PLAVIX) 75 MG tablet, Take 1 tablet (75 mg total) by mouth daily., Disp: 90 tablet, Rfl: 3   cyclobenzaprine (FLEXERIL) 10 MG tablet, Take 1 tablet (10 mg total) by mouth at bedtime., Disp: 90 tablet, Rfl: 3   FLUoxetine (PROZAC) 20 MG capsule, Take 1 capsule (20 mg total) by mouth 2 (two) times daily., Disp: 180 capsule, Rfl:  3   Glucagon (BAQSIMI ONE PACK) 3 MG/DOSE POWD, 3 mg by Left Nare route once as needed for up to 1 dose. For severe hypoglycemia with impaired consciousness, Disp: , Rfl:    HUMALOG 100 UNIT/ML injection, Inject into the skin 3 (three) times daily with meals. Per sliding scale, Disp: , Rfl:    insulin degludec (TRESIBA FLEXTOUCH) 100 UNIT/ML FlexTouch Pen, Inject into the skin., Disp: , Rfl:    lisinopril (ZESTRIL) 2.5 MG tablet, Take 1 tablet (2.5 mg total) by mouth at bedtime., Disp: 90 tablet, Rfl: 3   pantoprazole (PROTONIX) 40 MG tablet, Take 1 tablet (40 mg total) by mouth daily., Disp: 90 tablet, Rfl: 3   rosuvastatin (CRESTOR) 10 MG tablet, TAKE 1 TABLET BY MOUTH EVERY NIGHT AT BEDTIME, Disp: 90 tablet, Rfl: 3   rosuvastatin (CRESTOR) 10 MG tablet, TAKE 1 TABLET BY MOUTH EVERY NIGHT AT BEDTIME, Disp: 90 tablet, Rfl: 3   topiramate (TOPAMAX) 50 MG tablet, Take 1 tablet (50 mg total) by mouth 2 (two) times daily., Disp: 180 tablet, Rfl: 3   Allergies  Allergen Reactions   Other Itching    Steri strips     ROS Review of Systems  Constitutional: Negative.   HENT: Negative.    Eyes: Negative.   Respiratory: Negative.    Cardiovascular: Negative.   Gastrointestinal: Negative.   Endocrine: Negative.   Genitourinary: Negative.   Musculoskeletal: Negative.   Skin: Negative.   Allergic/Immunologic: Negative.   Neurological: Negative.   Hematological: Negative.   Psychiatric/Behavioral: Negative.    All other systems reviewed and are negative.    Objective:    Physical Exam Vitals reviewed.  Constitutional:      Appearance: Normal appearance.  HENT:     Mouth/Throat:     Mouth: Mucous membranes are moist.  Eyes:     Pupils: Pupils are equal, round, and reactive to light.  Neck:     Vascular: No carotid bruit.  Cardiovascular:     Rate and Rhythm: Normal rate and regular rhythm.     Pulses: Normal pulses.     Heart sounds: Normal heart sounds.  Pulmonary:     Effort:  Pulmonary effort is normal.     Breath sounds: Normal breath sounds.  Abdominal:     General: Bowel sounds are normal.     Palpations: Abdomen is soft. There is no hepatomegaly, splenomegaly or mass.     Tenderness: There is no abdominal tenderness.     Hernia: No hernia is present.  Musculoskeletal:        General: No tenderness.     Cervical back: Neck supple.     Right lower leg: No edema.     Left lower leg: No edema.  Skin:    Findings: No rash.  Neurological:     Mental Status: She is alert and oriented to person, place, and time.     Motor: No weakness.  Psychiatric:        Mood and Affect: Mood and affect normal.        Behavior: Behavior normal.    BP 119/74    Pulse 87    Ht 5\' 5"  (1.651 m)    Wt 145 lb (65.8 kg)    BMI 24.13 kg/m  Wt Readings from Last 3 Encounters:  02/03/21 145 lb (65.8 kg)  09/02/20 142 lb 4.8 oz (64.5 kg)  03/25/20 143 lb 8 oz (65.1 kg)     Health Maintenance Due  Topic Date Due   Hepatitis C Screening  Never done   COVID-19 Vaccine (3 - Booster for Pfizer series) 05/04/2019    There are no preventive care reminders to display for this patient.  No results found for: TSH Lab Results  Component Value Date   WBC 6.9 02/09/2017   HGB 13.1 02/09/2017   HCT 40.6 02/09/2017   MCV 96.7 02/09/2017   PLT 272 02/09/2017   Lab Results  Component Value Date   NA 139 02/09/2017   K 4.9 02/09/2017   CO2 18 (L) 02/09/2017   GLUCOSE 118 (Franco) 02/09/2017   BUN 25 (Franco) 02/09/2017   CREATININE 1.03 (Franco) 02/09/2017   CALCIUM 9.9 02/09/2017   ANIONGAP 13 02/09/2017   No results found for: CHOL No results found for: HDL No results found for: LDLCALC No results found for: TRIG No results found for: CHOLHDL Lab Results  Component Value Date   HGBA1C 7.2 01/24/2021      Assessment & Plan:   Problem List Items Addressed This Visit       Cardiovascular and Mediastinum   Retinal microaneurysm of both eyes    Refer to eye specialist       Essential hypertension     Patient denies any chest pain or shortness of breath there is no history of palpitation or paroxysmal nocturnal dyspnea   patient was advised to follow low-salt low-cholesterol diet    ideally I want to keep systolic blood pressure below 161130 mmHg, patient was asked to check blood pressure one times a week and give me a report on that.  Patient will be follow-up in 3 months  or earlier as needed, patient will call me back for any change in the cardiovascular symptoms Patient was advised to buy a book from local bookstore concerning blood pressure and read several chapters  every day.  This will be supplemented by some of the material we will give him from the office.  Patient should also utilize other resources like YouTube and Internet to learn more about the blood pressure and the diet.        Endocrine   Type 2 diabetes mellitus without complication, without long-term current use of insulin (HCC) - Primary    - I encouraged the patient to lose weight.  - I educated them on making healthy dietary choices including eating more fruits and vegetables and less fried foods. - I encouraged the patient to exercise more, and educated on the benefits of exercise including weight loss, diabetes prevention, and  hypertension prevention.   Dietary counseling with a registered dietician  Referral to a weight management support group (e.g. Weight Watchers, Overeaters Anonymous)  If your BMI is greater than 29 or you have gained more than 15 pounds you should work on weight loss.  Attend a healthy cooking class       Relevant Orders   POCT glucose (manual entry) (Completed)     Musculoskeletal and Integument   Arthritis of finger of right hand    Stable at the present time        Other   Posterior vitreous detachment of right eye    Refer to eye specialist       No orders of the defined types were placed in this encounter.   Follow-up: No follow-ups on file.     Corky Downs, MD

## 2021-02-03 NOTE — Assessment & Plan Note (Signed)
Refer to eye specialist °

## 2021-02-03 NOTE — Assessment & Plan Note (Signed)
Refer to eye specialist

## 2021-02-03 NOTE — Assessment & Plan Note (Signed)

## 2021-02-03 NOTE — Assessment & Plan Note (Signed)
Stable at the present time. 

## 2021-02-03 NOTE — Assessment & Plan Note (Signed)

## 2021-02-11 ENCOUNTER — Encounter: Payer: Self-pay | Admitting: Internal Medicine

## 2021-04-05 ENCOUNTER — Encounter (INDEPENDENT_AMBULATORY_CARE_PROVIDER_SITE_OTHER): Payer: Medicare Other | Admitting: Ophthalmology

## 2021-04-07 ENCOUNTER — Ambulatory Visit (INDEPENDENT_AMBULATORY_CARE_PROVIDER_SITE_OTHER): Payer: Medicare Other | Admitting: Ophthalmology

## 2021-04-07 ENCOUNTER — Other Ambulatory Visit: Payer: Self-pay

## 2021-04-07 ENCOUNTER — Encounter (INDEPENDENT_AMBULATORY_CARE_PROVIDER_SITE_OTHER): Payer: Self-pay | Admitting: Ophthalmology

## 2021-04-07 DIAGNOSIS — E103592 Type 1 diabetes mellitus with proliferative diabetic retinopathy without macular edema, left eye: Secondary | ICD-10-CM

## 2021-04-07 DIAGNOSIS — E103591 Type 1 diabetes mellitus with proliferative diabetic retinopathy without macular edema, right eye: Secondary | ICD-10-CM

## 2021-04-07 DIAGNOSIS — H4312 Vitreous hemorrhage, left eye: Secondary | ICD-10-CM

## 2021-04-07 NOTE — Progress Notes (Signed)
? ? ?04/07/2021 ? ?  ? ?CHIEF COMPLAINT ?Patient presents for  ?Chief Complaint  ?Patient presents with  ? Diabetic Retinopathy without Macular Edema  ? ? ? ? ?HISTORY OF PRESENT ILLNESS: ?Gail Franco is a 66 y.o. female who presents to the clinic today for:  ? ?HPI   ?7 mos fu ou fp. ?Pt states no vision changes. Pt denies floaters and FOL. ?Pt states no changes in medical history. ?Pt is taking insulin to maintain diabetes. ? ? ? ?Last edited by Silvestre Moment on 04/07/2021  8:42 AM.  ?  ? ? ?Referring physician: ?Cletis Athens, MD ?[address] ?[place],  [place] [address] ? ?HISTORICAL INFORMATION:  ? ?Selected notes from the [facility] ?  ? ?Lab Results  ?Component Value Date  ? HGBA1C 7.2 01/24/2021  ?  ? ?CURRENT MEDICATIONS: ?No current outpatient medications on file. (Ophthalmic Drugs)  ? ?No current facility-administered medications for this visit. (Ophthalmic Drugs)  ? ?Current Outpatient Medications (Other)  ?Medication Sig  ? acetaminophen (TYLENOL) 500 MG tablet Take 500 mg by mouth every 6 (six) hours as needed for pain.  ? carvedilol (COREG) 6.25 MG tablet Take 1 tablet (6.25 mg total) by mouth 2 (two) times daily with a meal.  ? clopidogrel (PLAVIX) 75 MG tablet Take 1 tablet (75 mg total) by mouth daily.  ? cyclobenzaprine (FLEXERIL) 10 MG tablet Take 1 tablet (10 mg total) by mouth at bedtime.  ? FLUoxetine (PROZAC) 20 MG capsule Take 1 capsule (20 mg total) by mouth 2 (two) times daily.  ? Glucagon (BAQSIMI ONE PACK) 3 MG/DOSE POWD 3 mg by Left Nare route once as needed for up to 1 dose. For severe hypoglycemia with impaired consciousness  ? HUMALOG 100 UNIT/ML injection Inject into the skin 3 (three) times daily with meals. Per sliding scale  ? insulin degludec (TRESIBA FLEXTOUCH) 100 UNIT/ML FlexTouch Pen Inject into the skin.  ? lisinopril (ZESTRIL) 2.5 MG tablet Take 1 tablet (2.5 mg total) by mouth at bedtime.  ? pantoprazole (PROTONIX) 40 MG tablet Take 1 tablet (40 mg total) by mouth daily.   ? rosuvastatin (CRESTOR) 10 MG tablet TAKE 1 TABLET BY MOUTH EVERY NIGHT AT BEDTIME  ? rosuvastatin (CRESTOR) 10 MG tablet TAKE 1 TABLET BY MOUTH EVERY NIGHT AT BEDTIME  ? topiramate (TOPAMAX) 50 MG tablet Take 1 tablet (50 mg total) by mouth 2 (two) times daily.  ? ?No current facility-administered medications for this visit. (Other)  ? ? ? ? ?REVIEW OF SYSTEMS: ?ROS   ?Negative for: Constitutional, Gastrointestinal, Neurological, Skin, Genitourinary, Musculoskeletal, HENT, Endocrine, Cardiovascular, Eyes, Respiratory, Psychiatric, Allergic/Imm, Heme/Lymph ?Last edited by Silvestre Moment on 04/07/2021  8:42 AM.  ?  ? ? ? ?ALLERGIES ?Allergies  ?Allergen Reactions  ? Other Itching  ?  Steri strips ?  ? ? ?PAST MEDICAL HISTORY ?Past Medical History:  ?Diagnosis Date  ? Arthritis   ? Diffuse cystic mastopathy   ? Myocardial infarction [facility])   ? PONV (postoperative nausea and vomiting)   ? always gets nauseated, needs Zofran  ? Thyroid disease 2008  ? no longer on medications  ? ?Past Surgical History:  ?Procedure Laterality Date  ? ABDOMINAL HYSTERECTOMY  1992  ? APPENDECTOMY    ? BREAST BIOPSY  1992  ? CARDIAC CATHETERIZATION  2010  ? CARPAL TUNNEL RELEASE    ? COLONOSCOPY  2010  ? CORONARY ARTERY BYPASS GRAFT  1995  ? EYE SURGERY Left   ? cataract with lens  implant  ? EYE SURGERY Right 2018  ? cataract with lens implant  ? lumbar fusions  2010  ? LUMBAR WOUND DEBRIDEMENT N/A 02/09/2017  ? Procedure: LUMBAR WOUND REVISION WITH REMOVAL OF PALTE;  Surgeon: Eustace Moore, MD;  Location: [facility];  Service: Neurosurgery;  Laterality: N/A;  ? OTHER SURGICAL HISTORY  2011  ? scar revision d/t fistula lumbar fusion  ? SHOULDER SURGERY    ? UPPER GI ENDOSCOPY  2010, 2011  ? ? ?FAMILY HISTORY ?Family History  ?Problem Relation Age of Onset  ? Atrial fibrillation Mother   ? Heart disease Father   ? ? ?SOCIAL HISTORY ?Social History  ? ?Tobacco Use  ? Smoking status: Never  ? Smokeless tobacco: Never  ?Vaping Use  ? Vaping Use: Never used   ?Substance Use Topics  ? Alcohol use: No  ? Drug use: No  ? ?  ? ?  ? ?OPHTHALMIC EXAM: ? ?Base Eye Exam   ? ? Visual Acuity (ETDRS)   ? ?   Right Left  ? Dist [place] 20/25 -2 20/25 -2  ? ?  ?  ? ? Tonometry (Tonopen, 8:48 AM)   ? ?   Right Left  ? Pressure 11 13  ? ?  ?  ? ? Pupils   ? ?   Pupils Dark Light Shape React APD  ? Right PERRL 4 3 Round Brisk None  ? Left PERRL 4 3 Round Brisk None  ? ?  ?  ? ? Visual Fields   ? ?   Left Right  ?  Full Full  ? ?  ?  ? ? Extraocular Movement   ? ?   Right Left  ?  Full Full  ? ?  ?  ? ? Neuro/Psych   ? ? Oriented x3: Yes  ? Mood/Affect: Normal  ? ?  ?  ? ? Dilation   ? ? Both eyes: 1.0% Mydriacyl, 2.5% Phenylephrine @ 8:48 AM  ? ?  ?  ? ?  ? ?Slit Lamp and Fundus Exam   ? ? External Exam   ? ?   Right Left  ? External Normal Normal  ? ?  ?  ? ? Slit Lamp Exam   ? ?   Right Left  ? Lids/Lashes Normal Normal  ? Conjunctiva/Sclera White and quiet White and quiet  ? Cornea Clear Clear  ? Anterior Chamber Deep and quiet Deep and quiet  ? Iris Round and reactive Round and reactive  ? Lens Posterior chamber intraocular lens Posterior chamber intraocular lens  ? Anterior Vitreous Posterior vitreous detachment Posterior vitreous detachment  ? ?  ?  ? ? Fundus Exam   ? ?   Right Left  ? Posterior Vitreous Posterior vitreous detachment Posterior vitreous detachment, Pre-retinal hemorrhage boat shaped hemorrhage inferior to macula.,  Much smaller than near the equator inferiorly to  ? Disc Normal Normal  ? C/D Ratio 0.4 0.4  ? Macula Microaneurysms Microaneurysms  ? Vessels PDR-quiet  diabetic retinopathy OS PDR, active with preretinal vitreous hemorrhage  ? Periphery Diabetic retinopathy, quiet, good PRP Diabetic retinopathy, active with preretinal hemorrhage inferiorly, room anterior for more laser  ? ?  ?  ? ?  ? ? ?IMAGING AND PROCEDURES  ?Imaging and Procedures for 04/07/21 ? ?Color Fundus Photography Optos - OU - Both Eyes   ? ?   ?Right Eye ?Progression has been stable. Disc  findings include normal observations. Macula : normal observations, microaneurysms.  ? ?  Left Eye ?Progression has worsened. Disc findings include normal observations. Macula : microaneurysms.  ? ?Notes ?Quiescent PDR OD, clear media good PRP ? ?OS now with media clear, no recurrence no presence of hemorrhage, good PRP peripherally 360 ? ?  ? ? ?  ?  ? ?  ?ASSESSMENT/PLAN: ? ?Vitreous hemorrhage, left eye ([place]) ?Condition has cleared OS ? ?Proliferative diabetic retinopathy of left eye determined by examination associated with type 1 diabetes mellitus ([place]) ?The nature of regressed proliferative diabetic retinopathy was discussed with the patient. The patient was advised to maintain good glucose, blood pressure, monitor kidney function and serum lipid control as advised by personal physician. Rare risk for reactivation of progression exist with untreated severe anemia, untreated renal failure, untreated heart failure, and smoking. ?Complete avoidance of smoking was recommended. The chance of recurrent proliferative diabetic retinopathy was discussed as well as the chance of vitreous hemorrhage for which further treatments may be necessary.   Explained to the patient that the quiescent  proliferative diabetic retinopathy disease is unlikely to ever worsen.  Worsening factors would include however severe anemia, hypertension out-of-control or impending renal failure. ? ?Diabetic maculopathy of right eye with proliferative retinopathy determined by examination associated with type 1 diabetes mellitus ([place]) ?The nature of regressed proliferative diabetic retinopathy was discussed with the patient. The patient was advised to maintain good glucose, blood pressure, monitor kidney function and serum lipid control as advised by personal physician. Rare risk for reactivation of progression exist with untreated severe anemia, untreated renal failure, untreated heart failure, and smoking. ?Complete avoidance of smoking was  recommended. The chance of recurrent proliferative diabetic retinopathy was discussed as well as the chance of vitreous hemorrhage for which further treatments may be necessary.   Explained to the patient that

## 2021-04-07 NOTE — Assessment & Plan Note (Signed)
Condition has cleared OS ?

## 2021-04-07 NOTE — Assessment & Plan Note (Signed)

## 2021-04-21 ENCOUNTER — Other Ambulatory Visit: Payer: Self-pay | Admitting: Internal Medicine

## 2021-04-26 ENCOUNTER — Other Ambulatory Visit: Payer: Self-pay | Admitting: *Deleted

## 2021-04-26 MED ORDER — LISINOPRIL 2.5 MG PO TABS: 2.5000 mg | ORAL_TABLET | Freq: Every day | ORAL | 3 refills | Status: DC

## 2021-04-26 MED ORDER — TOPIRAMATE 50 MG PO TABS: 50.0000 mg | ORAL_TABLET | Freq: Two times a day (BID) | ORAL | 3 refills | Status: AC

## 2021-06-02 ENCOUNTER — Encounter: Payer: Self-pay | Admitting: Internal Medicine

## 2021-06-02 ENCOUNTER — Ambulatory Visit (INDEPENDENT_AMBULATORY_CARE_PROVIDER_SITE_OTHER): Payer: Medicare Other | Admitting: Internal Medicine

## 2021-06-02 VITALS — BP 137/78 | HR 68 | Ht 65.0 in | Wt 147.9 lb

## 2021-06-02 DIAGNOSIS — J301 Allergic rhinitis due to pollen: Secondary | ICD-10-CM

## 2021-06-02 DIAGNOSIS — H43811 Vitreous degeneration, right eye: Secondary | ICD-10-CM

## 2021-06-02 DIAGNOSIS — M25552 Pain in left hip: Secondary | ICD-10-CM

## 2021-06-02 DIAGNOSIS — M19041 Primary osteoarthritis, right hand: Secondary | ICD-10-CM

## 2021-06-02 DIAGNOSIS — Z1231 Encounter for screening mammogram for malignant neoplasm of breast: Secondary | ICD-10-CM | POA: Diagnosis not present

## 2021-06-02 DIAGNOSIS — I10 Essential (primary) hypertension: Secondary | ICD-10-CM | POA: Diagnosis not present

## 2021-06-02 DIAGNOSIS — E119 Type 2 diabetes mellitus without complications: Secondary | ICD-10-CM | POA: Diagnosis not present

## 2021-06-02 DIAGNOSIS — H35043 Retinal micro-aneurysms, unspecified, bilateral: Secondary | ICD-10-CM

## 2021-06-02 LAB — GLUCOSE, POCT (MANUAL RESULT ENTRY): POC Glucose: 120 mg/dl — AB (ref 70–99)

## 2021-06-02 NOTE — Assessment & Plan Note (Signed)
Patient has swelling of the right little finger ref to the orthopedics. ?

## 2021-06-02 NOTE — Addendum Note (Signed)
Addended by: Jobie Quaker on: 06/02/2021 12:05 PM ? ? Modules accepted: Orders ? ?

## 2021-06-02 NOTE — Assessment & Plan Note (Signed)

## 2021-06-02 NOTE — Assessment & Plan Note (Signed)
Stable at the present time. 

## 2021-06-02 NOTE — Assessment & Plan Note (Signed)
Under control 

## 2021-06-02 NOTE — Assessment & Plan Note (Signed)
Stable at the present time refer to the eye doctor ?

## 2021-06-02 NOTE — Progress Notes (Signed)
? ?Established Patient Office Visit ? ?Subjective:  ?Patient ID: Gail Franco, female    DOB: Mar 06, 1955  Age: 66 y.o. MRN: [id number] ? ?CC:  ?Chief Complaint  ?Patient presents with  ? Diabetes  ?  Patient is here for her 2 month follow up  ? ? ?Diabetes ?Pertinent negatives for hypoglycemia include no dizziness or speech difficulty.  ? ?Gail Franco presents for check up ? ?Past Medical History:  ?Diagnosis Date  ? Arthritis   ? Diffuse cystic mastopathy   ? Myocardial infarction [facility])   ? PONV (postoperative nausea and vomiting)   ? always gets nauseated, needs Zofran  ? Thyroid disease 2008  ? no longer on medications  ? ? ?Past Surgical History:  ?Procedure Laterality Date  ? ABDOMINAL HYSTERECTOMY  1992  ? APPENDECTOMY    ? BREAST BIOPSY  1992  ? CARDIAC CATHETERIZATION  2010  ? CARPAL TUNNEL RELEASE    ? COLONOSCOPY  2010  ? CORONARY ARTERY BYPASS GRAFT  1995  ? EYE SURGERY Left   ? cataract with lens implant  ? EYE SURGERY Right 2018  ? cataract with lens implant  ? lumbar fusions  2010  ? LUMBAR WOUND DEBRIDEMENT N/A 02/09/2017  ? Procedure: LUMBAR WOUND REVISION WITH REMOVAL OF PALTE;  Surgeon: Tia Alert, MD;  Location: [facility] OR;  Service: Neurosurgery;  Laterality: N/A;  ? OTHER SURGICAL HISTORY  2011  ? scar revision d/t fistula lumbar fusion  ? SHOULDER SURGERY    ? UPPER GI ENDOSCOPY  2010, 2011  ? ? ?Family History  ?Problem Relation Age of Onset  ? Atrial fibrillation Mother   ? Heart disease Father   ? ? ?Social History  ? ?Socioeconomic History  ? Marital status: Married  ?  Spouse name: Not on file  ? Number of children: Not on file  ? Years of education: Not on file  ? Highest education level: Not on file  ?Occupational History  ? Not on file  ?Tobacco Use  ? Smoking status: Never  ? Smokeless tobacco: Never  ?Vaping Use  ? Vaping Use: Never used  ?Substance and Sexual Activity  ? Alcohol use: No  ? Drug use: No  ? Sexual activity: Not on file  ?Other Topics Concern  ? Not on file  ?Social  History Narrative  ? Not on file  ? ?Social Determinants of Health  ? ?Financial Resource Strain: Low Risk   ? Difficulty of Paying Living Expenses: Not hard at all  ?Food Insecurity: No Food Insecurity  ? Worried About Programme researcher, broadcasting/film/video in the Last Year: Never true  ? Ran Out of Food in the Last Year: Never true  ?Transportation Needs: No Transportation Needs  ? Lack of Transportation (Medical): No  ? Lack of Transportation (Non-Medical): No  ?Physical Activity: Sufficiently Active  ? Days of Exercise per Week: 7 days  ? Minutes of Exercise per Session: 60 min  ?Stress: No Stress Concern Present  ? Feeling of Stress : Not at all  ?Social Connections: Socially Integrated  ? Frequency of Communication with Friends and Family: More than three times a week  ? Frequency of Social Gatherings with Friends and Family: More than three times a week  ? Attends Religious Services: More than 4 times per year  ? Active Member of Clubs or Organizations: No  ? Attends Banker Meetings: More than 4 times per year  ? Marital Status: Married  ?Intimate Partner Violence: Not  At Risk  ? Fear of Current or Ex-Partner: No  ? Emotionally Abused: No  ? Physically Abused: No  ? Sexually Abused: No  ? ? ? ?Current Outpatient Medications:  ?  acetaminophen (TYLENOL) 500 MG tablet, Take 500 mg by mouth every 6 (six) hours as needed for pain., Disp: , Rfl:  ?  carvedilol (COREG) 6.25 MG tablet, Take 1 tablet (6.25 mg total) by mouth 2 (two) times daily with a meal., Disp: 180 tablet, Rfl: 3 ?  clopidogrel (PLAVIX) 75 MG tablet, TAKE 1 TABLET BY MOUTH DAILY. GENERIC EQUIVALENT FOR PLAVIX, Disp: 90 tablet, Rfl: 3 ?  cyclobenzaprine (FLEXERIL) 10 MG tablet, Take 1 tablet (10 mg total) by mouth at bedtime., Disp: 90 tablet, Rfl: 3 ?  FLUoxetine (PROZAC) 20 MG capsule, Take 1 capsule (20 mg total) by mouth 2 (two) times daily., Disp: 180 capsule, Rfl: 3 ?  Glucagon (BAQSIMI ONE PACK) 3 MG/DOSE POWD, 3 mg by Left Nare route once as  needed for up to 1 dose. For severe hypoglycemia with impaired consciousness, Disp: , Rfl:  ?  HUMALOG 100 UNIT/ML injection, Inject into the skin 3 (three) times daily with meals. Per sliding scale, Disp: , Rfl:  ?  insulin degludec (TRESIBA FLEXTOUCH) 100 UNIT/ML FlexTouch Pen, Inject into the skin., Disp: , Rfl:  ?  lisinopril (ZESTRIL) 2.5 MG tablet, Take 1 tablet (2.5 mg total) by mouth at bedtime., Disp: 90 tablet, Rfl: 3 ?  pantoprazole (PROTONIX) 40 MG tablet, Take 1 tablet (40 mg total) by mouth daily., Disp: 90 tablet, Rfl: 3 ?  rosuvastatin (CRESTOR) 10 MG tablet, TAKE 1 TABLET BY MOUTH EVERY NIGHT AT BEDTIME, Disp: 90 tablet, Rfl: 3 ?  topiramate (TOPAMAX) 50 MG tablet, Take 1 tablet (50 mg total) by mouth 2 (two) times daily., Disp: 180 tablet, Rfl: 3  ? ?Allergies  ?Allergen Reactions  ? Other Itching  ?  Steri strips ?  ? ? ?ROS ?Review of Systems  ?Constitutional: Negative.   ?HENT: Negative.    ?Eyes: Negative.   ?Respiratory: Negative.    ?Cardiovascular: Negative.   ?Gastrointestinal: Negative.   ?Endocrine: Negative.   ?Genitourinary: Negative.   ?Musculoskeletal: Negative.   ?Skin: Negative.   ?Allergic/Immunologic: Negative.   ?Neurological: Negative.  Negative for dizziness, facial asymmetry and speech difficulty.  ?Hematological: Negative.   ?Psychiatric/Behavioral: Negative.    ?All other systems reviewed and are negative. ? ?  ?Objective:  ?  ?Physical Exam ?Vitals reviewed.  ?Constitutional:   ?   Appearance: Normal appearance.  ?HENT:  ?   Mouth/Throat:  ?   Mouth: Mucous membranes are moist.  ?Eyes:  ?   Pupils: Pupils are equal, round, and reactive to light.  ?Neck:  ?   Vascular: No carotid bruit.  ?Cardiovascular:  ?   Rate and Rhythm: Normal rate and regular rhythm.  ?   Pulses: Normal pulses.  ?   Heart sounds: Normal heart sounds.  ?Pulmonary:  ?   Effort: Pulmonary effort is normal.  ?   Breath sounds: Normal breath sounds.  ?Abdominal:  ?   General: Bowel sounds are normal.  ?    Palpations: Abdomen is soft. There is no hepatomegaly, splenomegaly or mass.  ?   Tenderness: There is no abdominal tenderness.  ?   Hernia: No hernia is present.  ?Musculoskeletal:     ?   General: No tenderness.  ?   Right hand: Swelling present.  ?   Left hand: Bony tenderness present.  ?  Arms: ? ?   Cervical back: Neck supple.  ?   Right lower leg: No edema.  ?   Left lower leg: No edema.  ?     Legs: ? ?   Comments: Lt hip pain , tender lt trochanter ? Rt little finger swelling  ?Skin: ?   Findings: No rash.  ?Neurological:  ?   Mental Status: She is alert and oriented to person, place, and time.  ?   Motor: No weakness.  ?Psychiatric:     ?   Mood and Affect: Mood and affect normal.     ?   Behavior: Behavior normal.  ? ? ?BP 137/78   Pulse 68   Ht 5\' 5"  (1.651 m)   Wt 147 lb 14.4 oz (67.1 kg)   BMI 24.61 kg/m?  ?Wt Readings from Last 3 Encounters:  ?06/02/21 147 lb 14.4 oz (67.1 kg)  ?02/03/21 145 lb (65.8 kg)  ?09/02/20 142 lb 4.8 oz (64.5 kg)  ? ? ? ?Health Maintenance Due  ?Topic Date Due  ? Hepatitis C Screening  Never done  ? Zoster Vaccines- Shingrix (1 of 2) Never done  ? COVID-19 Vaccine (3 - Booster for Pfizer series) 05/04/2019  ? ? ?There are no preventive care reminders to display for this patient. ? ?No results found for: TSH ?Lab Results  ?Component Value Date  ? WBC 6.9 02/09/2017  ? HGB 13.1 02/09/2017  ? HCT 40.6 02/09/2017  ? MCV 96.7 02/09/2017  ? PLT 272 02/09/2017  ? ?Lab Results  ?Component Value Date  ? NA 139 02/09/2017  ? K 4.9 02/09/2017  ? CO2 18 (L) 02/09/2017  ? GLUCOSE 118 (H) 02/09/2017  ? BUN 25 (H) 02/09/2017  ? CREATININE 1.03 (H) 02/09/2017  ? CALCIUM 9.9 02/09/2017  ? ANIONGAP 13 02/09/2017  ? ?No results found for: CHOL ?No results found for: HDL ?No results found for: LDLCALC ?No results found for: TRIG ?No results found for: CHOLHDL ?Lab Results  ?Component Value Date  ? HGBA1C 7.2 01/24/2021  ? ? ?  ?Assessment & Plan:  ? ?Problem List Items Addressed This Visit    ? ?  ? Cardiovascular and Mediastinum  ? Retinal microaneurysm of both eyes  ?  Stable at the present time refer to the eye doctor ? ?  ?  ? Essential hypertension  ?   Patient denies any chest pain or sho

## 2021-06-14 ENCOUNTER — Other Ambulatory Visit: Payer: Self-pay | Admitting: Internal Medicine

## 2021-06-15 ENCOUNTER — Other Ambulatory Visit: Payer: Self-pay | Admitting: *Deleted

## 2021-06-15 MED ORDER — CYCLOBENZAPRINE HCL 10 MG PO TABS
10.0000 mg | ORAL_TABLET | Freq: Every day | ORAL | 3 refills | Status: AC
Start: 2021-06-15 — End: ?

## 2021-08-04 ENCOUNTER — Other Ambulatory Visit: Payer: Self-pay | Admitting: *Deleted

## 2021-08-04 MED ORDER — LISINOPRIL 2.5 MG PO TABS: 2.5000 mg | ORAL_TABLET | Freq: Every day | ORAL | 3 refills | Status: AC

## 2021-08-04 MED ORDER — CARVEDILOL 6.25 MG PO TABS: 6.2500 mg | ORAL_TABLET | Freq: Two times a day (BID) | ORAL | 3 refills | Status: AC

## 2021-08-23 ENCOUNTER — Encounter (INDEPENDENT_AMBULATORY_CARE_PROVIDER_SITE_OTHER): Payer: Self-pay | Admitting: Ophthalmology

## 2021-08-23 ENCOUNTER — Ambulatory Visit (INDEPENDENT_AMBULATORY_CARE_PROVIDER_SITE_OTHER): Payer: Medicare Other | Admitting: Ophthalmology

## 2021-08-23 ENCOUNTER — Encounter (INDEPENDENT_AMBULATORY_CARE_PROVIDER_SITE_OTHER): Payer: Medicare Other | Admitting: Ophthalmology

## 2021-08-23 DIAGNOSIS — E103592 Type 1 diabetes mellitus with proliferative diabetic retinopathy without macular edema, left eye: Secondary | ICD-10-CM | POA: Diagnosis not present

## 2021-08-23 DIAGNOSIS — E103591 Type 1 diabetes mellitus with proliferative diabetic retinopathy without macular edema, right eye: Secondary | ICD-10-CM | POA: Diagnosis not present

## 2021-08-23 DIAGNOSIS — H4312 Vitreous hemorrhage, left eye: Secondary | ICD-10-CM | POA: Diagnosis not present

## 2021-08-23 MED ORDER — BEVACIZUMAB CHEMO INJECTION 1.25MG/0.05ML SYRINGE FOR KALEIDOSCOPE
1.2500 mg | INTRAVITREAL | Status: AC | PRN
  Administered 2021-08-23: 1.25 mg via INTRAVITREAL

## 2021-08-23 NOTE — Patient Instructions (Signed)
Left eye, patient instructed in head of bed elevated at night to allow for gravity pull the hemorrhage on a line of sight  If visual acuity decline significantly left eye, patient to call and schedule vitrectomy endolaser left eye to clear visual acuity

## 2021-08-23 NOTE — Addendum Note (Signed)
Addended by: Fawn Kirk A on: 08/23/2021 03:42 PM   Modules accepted: Orders

## 2021-08-23 NOTE — Assessment & Plan Note (Signed)
Quiescent PDR OD 

## 2021-08-23 NOTE — Assessment & Plan Note (Signed)
Severe worsening, with now dense dispersed vitreous hemorrhage and preretinal hemorrhage likely from posterior vitreous detachment progressing with tractional changes on regressed NVE or NVD leading to the hemorrhage.  In either case retina is attached.  Good peripheral PRP.  There is no risk of retinal detachment.  I explained to the patient that vitreous hemorrhage is likely to clear in most cases but if not doing so over the next 2 weeks we can schedule vitrectomy endolaser  Photocoagulation left eye to recover visual acuity and functioning.  Patient instructed to call if the symptoms worsen or if she is not able to function adequately with the current level of vitreous opacification OS

## 2021-08-23 NOTE — Progress Notes (Addendum)
08/23/2021     CHIEF COMPLAINT Patient presents for  Chief Complaint  Patient presents with   Diabetic Retinopathy without Macular Edema      HISTORY OF PRESENT ILLNESS: Gail Franco is a 66 y.o. female who presents to the clinic today for:   HPI   WIP- Floaters OS, in the setting of type 1 diabetes with a history of previous proliferative diabetic retinopathy with small vitreous hemorrhage in the left eye in the past.  Good PRP OU. Pt states her vision has been stable  Pt admits to a really big floater in her left eye that just came out of no where on Saturday when she was inside reading and her vision became cloudy  Was seen at hospital in [place] found to have vitreous hemorrhage and asked her to follow-up here Pt states she went to the ER and they just did an ultrasound.  Last edited by Edmon Crape, MD on 08/23/2021  3:41 PM.      Referring physician: Corky Downs, MD [address] [place],  [place] [address]  HISTORICAL INFORMATION:   Selected notes from the MEDICAL RECORD NUMBER    Lab Results  Component Value Date   HGBA1C 7.2 01/24/2021     CURRENT MEDICATIONS: No current outpatient medications on file. (Ophthalmic Drugs)   No current facility-administered medications for this visit. (Ophthalmic Drugs)   Current Outpatient Medications (Other)  Medication Sig   acetaminophen (TYLENOL) 500 MG tablet Take 500 mg by mouth every 6 (six) hours as needed for pain.   carvedilol (COREG) 6.25 MG tablet Take 1 tablet (6.25 mg total) by mouth 2 (two) times daily with a meal.   clopidogrel (PLAVIX) 75 MG tablet TAKE 1 TABLET BY MOUTH DAILY. GENERIC EQUIVALENT FOR PLAVIX   cyclobenzaprine (FLEXERIL) 10 MG tablet Take 1 tablet (10 mg total) by mouth at bedtime.   FLUoxetine (PROZAC) 20 MG capsule Take 1 capsule (20 mg total) by mouth 2 (two) times daily.   Glucagon (BAQSIMI ONE PACK) 3 MG/DOSE POWD 3 mg by Left Nare route once as needed for up to 1 dose. For severe  hypoglycemia with impaired consciousness   HUMALOG 100 UNIT/ML injection Inject into the skin 3 (three) times daily with meals. Per sliding scale   insulin degludec (TRESIBA FLEXTOUCH) 100 UNIT/ML FlexTouch Pen Inject into the skin.   lisinopril (ZESTRIL) 2.5 MG tablet Take 1 tablet (2.5 mg total) by mouth at bedtime.   pantoprazole (PROTONIX) 40 MG tablet Take 1 tablet (40 mg total) by mouth daily.   rosuvastatin (CRESTOR) 10 MG tablet TAKE 1 TABLET BY MOUTH EVERY NIGHT AT BEDTIME   topiramate (TOPAMAX) 50 MG tablet Take 1 tablet (50 mg total) by mouth 2 (two) times daily.   No current facility-administered medications for this visit. (Other)      REVIEW OF SYSTEMS: ROS   Negative for: Constitutional, Gastrointestinal, Neurological, Skin, Genitourinary, Musculoskeletal, HENT, Endocrine, Cardiovascular, Eyes, Respiratory, Psychiatric, Allergic/Imm, Heme/Lymph Last edited by Gail Franco, CMA on 08/23/2021  2:49 PM.       ALLERGIES Allergies  Allergen Reactions   Other Itching    Steri strips     PAST MEDICAL HISTORY Past Medical History:  Diagnosis Date   Arthritis    Diffuse cystic mastopathy    Myocardial infarction (HCC)    PONV (postoperative nausea and vomiting)    always gets nauseated, needs Zofran   Thyroid disease 2008   no longer on medications   Past  Surgical History:  Procedure Laterality Date   ABDOMINAL HYSTERECTOMY  1992   APPENDECTOMY     BREAST BIOPSY  1992   CARDIAC CATHETERIZATION  2010   CARPAL TUNNEL RELEASE     COLONOSCOPY  2010   CORONARY ARTERY BYPASS GRAFT  1995   EYE SURGERY Left    cataract with lens implant   EYE SURGERY Right 2018   cataract with lens implant   lumbar fusions  2010   LUMBAR WOUND DEBRIDEMENT N/A 02/09/2017   Procedure: LUMBAR WOUND REVISION WITH REMOVAL OF PALTE;  Surgeon: Tia Alert, MD;  Location: [facility] OR;  Service: Neurosurgery;  Laterality: N/A;   OTHER SURGICAL HISTORY  2011   scar revision d/t fistula  lumbar fusion   SHOULDER SURGERY     UPPER GI ENDOSCOPY  2010, 2011    FAMILY HISTORY Family History  Problem Relation Age of Onset   Atrial fibrillation Mother    Heart disease Father     SOCIAL HISTORY Social History   Tobacco Use   Smoking status: Never   Smokeless tobacco: Never  Vaping Use   Vaping Use: Never used  Substance Use Topics   Alcohol use: No   Drug use: No         OPHTHALMIC EXAM:  Base Eye Exam     Visual Acuity (ETDRS)       Right Left   Dist [place] 20/25 +2 20/30         Tonometry (Tonopen, 2:55 PM)       Right Left   Pressure 8 13         Pupils       Pupils APD   Right PERRL None   Left PERRL None         Visual Fields       Left Right    Full Full         Extraocular Movement       Right Left    Full, Ortho Full, Ortho         Neuro/Psych     Oriented x3: Yes   Mood/Affect: Normal         Dilation     Left eye: 2.5% Phenylephrine, 1.0% Mydriacyl @ 2:50 PM           Slit Lamp and Fundus Exam     External Exam       Right Left   External Normal Normal         Slit Lamp Exam       Right Left   Lids/Lashes Normal Normal   Conjunctiva/Sclera White and quiet White and quiet   Cornea Clear Clear   Anterior Chamber Deep and quiet Deep and quiet   Iris Round and reactive Round and reactive   Lens Posterior chamber intraocular lens Posterior chamber intraocular lens   Anterior Vitreous Posterior vitreous detachment Posterior vitreous detachment         Fundus Exam       Right Left   Posterior Vitreous  Pre-retinal hemorrhage, Vitreous hemorrhage 3+   Disc  Normal   C/D Ratio declined dilation 0.4   Macula  Microaneurysms   Vessels  diabetic retinopathy OS PDR, active with preretinal vitreous hemorrhage   Periphery  Diabetic retinopathy, active with preretinal hemorrhage inferiorly, room anterior for more laser            IMAGING AND PROCEDURES  Imaging and Procedures for  08/23/21  Color Fundus Photography  Optos - OU - Both Eyes       Right Eye Progression has been stable. Disc findings include normal observations. Macula : normal observations, microaneurysms.   Left Eye Progression has worsened. Disc findings include normal observations. Macula : microaneurysms.   Notes Quiescent PDR OD, clear media good PRP  OS dense new preretinal vitreous hemorrhage OS.  Hampering activities of daily living as well as medical monitoring of condition OS     Intravitreal Injection, Pharmacologic Agent - OS - Left Eye       Time Out 08/23/2021. 3:38 PM. Confirmed correct patient, procedure, site, and patient consented.   Anesthesia Topical anesthesia was used. Anesthetic medications included Lidocaine 4%.   Procedure Preparation included 10% betadine to eyelids. A 30 gauge needle was used.   Injection: 1.25 mg Bevacizumab 1.25mg /0.50ml   Route: Intravitreal, Site: Left Eye   NDC: [id number]   Post-op Post injection exam found visual acuity of at least counting fingers. The patient tolerated the procedure well. There were no complications. The patient received written and verbal post procedure care education. Post injection medications included gentamicin.   Notes Intravitreal Avastin will be delivered today for protection of the eye from progression of PDR and also to diminish the risk of subsequent vitreous hemorrhage developing from neovascularization elsewhere on the nerve.               ASSESSMENT/PLAN:  Vitreous hemorrhage, left eye (HCC) Severe worsening, with now dense dispersed vitreous hemorrhage and preretinal hemorrhage likely from posterior vitreous detachment progressing with tractional changes on regressed NVE or NVD leading to the hemorrhage.  In either case retina is attached.  Good peripheral PRP.  There is no risk of retinal detachment.  I explained to the patient that vitreous hemorrhage is likely to clear in most cases but  if not doing so over the next 2 weeks we can schedule vitrectomy endolaser  Photocoagulation left eye to recover visual acuity and functioning.  Patient instructed to call if the symptoms worsen or if she is not able to function adequately with the current level of vitreous opacification OS  Proliferative diabetic retinopathy of left eye determined by examination associated with type 1 diabetes mellitus (HCC) Slight activity or possibly tractional change on the neovascularization leading to vitreous hemorrhage in the left eye.  Peripheral PRP protect the eye from progression of disease.  No sign of retinal detachment.  Diabetic maculopathy of right eye with proliferative retinopathy determined by examination associated with type 1 diabetes mellitus (HCC) Quiescent PDR OD     ICD-10-CM   1. Vitreous hemorrhage, left eye (HCC)  H43.12 Color Fundus Photography Optos - OU - Both Eyes    2. Proliferative diabetic retinopathy of left eye determined by examination associated with type 1 diabetes mellitus (HCC)  L38.1017 Intravitreal Injection, Pharmacologic Agent - OS - Left Eye    Bevacizumab (AVASTIN) SOLN 1.25 mg    3. Diabetic maculopathy of right eye with proliferative retinopathy determined by examination associated with type 1 diabetes mellitus (HCC)  E10.3591       1.  OS PDR slightly active likely vitreous hemorrhage from tractional changes however we will give the hemorrhage a chance to clear.  If no clearance over the next 2 weeks we will consider scheduling vitrectomy and endolaser left eye to clear visual axis and induce quiescence of PDR forever.  2.  Head of bed elevated at nighttime.  3.  Ophthalmic Meds Ordered this visit:  Meds ordered this encounter  Medications   Bevacizumab (AVASTIN) SOLN 1.25 mg       Return in about 2 weeks (around 09/06/2021) for dilate, OS, COLOR FP.  Patient Instructions  Left eye, patient instructed in head of bed elevated at night to allow  for gravity pull the hemorrhage on a line of sight  If visual acuity decline significantly left eye, patient to call and schedule vitrectomy endolaser left eye to clear visual acuity   Explained the diagnoses, plan, and follow up with the patient and they expressed understanding.  Patient expressed understanding of the importance of proper follow up care.   Alford Highland Annalynn Centanni M.D. Diseases & Surgery of the Retina and Vitreous [facility] 08/23/21     Abbreviations: M myopia (nearsighted); A astigmatism; H hyperopia (farsighted); P presbyopia; Mrx spectacle prescription;  CTL contact lenses; OD right eye; OS left eye; OU both eyes  XT exotropia; ET esotropia; PEK punctate epithelial keratitis; PEE punctate epithelial erosions; DES dry eye syndrome; MGD meibomian gland dysfunction; ATs artificial tears; PFAT's preservative free artificial tears; NSC nuclear sclerotic cataract; PSC posterior subcapsular cataract; ERM epi-retinal membrane; PVD posterior vitreous detachment; RD retinal detachment; DM diabetes mellitus; DR diabetic retinopathy; NPDR non-proliferative diabetic retinopathy; PDR proliferative diabetic retinopathy; CSME clinically significant macular edema; DME diabetic macular edema; dbh dot blot hemorrhages; CWS cotton wool spot; POAG primary open angle glaucoma; C/D cup-to-disc ratio; HVF humphrey visual field; GVF goldmann visual field; OCT optical coherence tomography; IOP intraocular pressure; BRVO Branch retinal vein occlusion; CRVO central retinal vein occlusion; CRAO central retinal artery occlusion; BRAO branch retinal artery occlusion; RT retinal tear; SB scleral buckle; PPV pars plana vitrectomy; VH Vitreous hemorrhage; PRP panretinal laser photocoagulation; IVK intravitreal kenalog; VMT vitreomacular traction; MH Macular hole;  NVD neovascularization of the disc; NVE neovascularization elsewhere; AREDS age related eye disease study; ARMD age related macular  degeneration; POAG primary open angle glaucoma; EBMD epithelial/anterior basement membrane dystrophy; ACIOL anterior chamber intraocular lens; IOL intraocular lens; PCIOL posterior chamber intraocular lens; Phaco/IOL phacoemulsification with intraocular lens placement; PRK photorefractive keratectomy; LASIK laser assisted in situ keratomileusis; HTN hypertension; DM diabetes mellitus; COPD chronic obstructive pulmonary disease

## 2021-08-23 NOTE — Assessment & Plan Note (Signed)
Slight activity or possibly tractional change on the neovascularization leading to vitreous hemorrhage in the left eye.  Peripheral PRP protect the eye from progression of disease.  No sign of retinal detachment.

## 2021-09-06 ENCOUNTER — Encounter (INDEPENDENT_AMBULATORY_CARE_PROVIDER_SITE_OTHER): Payer: Self-pay | Admitting: Ophthalmology

## 2021-09-06 ENCOUNTER — Ambulatory Visit (INDEPENDENT_AMBULATORY_CARE_PROVIDER_SITE_OTHER): Payer: Medicare Other | Admitting: Ophthalmology

## 2021-09-06 DIAGNOSIS — H4312 Vitreous hemorrhage, left eye: Secondary | ICD-10-CM | POA: Diagnosis not present

## 2021-09-06 DIAGNOSIS — E103592 Type 1 diabetes mellitus with proliferative diabetic retinopathy without macular edema, left eye: Secondary | ICD-10-CM | POA: Diagnosis not present

## 2021-09-06 NOTE — Assessment & Plan Note (Signed)
No signs of active NVE,

## 2021-09-06 NOTE — Assessment & Plan Note (Signed)
Preretinal hemorrhage is moved out of the line of sight some 2 weeks post injection Avastin for PDR, remnant.  This could been a tractional vitreous hemorrhage of could have been a small neovascularization that bled due to other systemic factors.  In either case the visual axis is cleared nicely symptomatically and on clinical examination.  Continue to monitor and observe if condition worsens at any time patient may contact the office for follow-up in a nonemergent fashion and we will reevaluate and consider vitrectomy to clear the visual axis once and for all

## 2021-09-06 NOTE — Progress Notes (Signed)
09/06/2021     CHIEF COMPLAINT Patient presents for  Chief Complaint  Patient presents with   Flashes/floaters   Diabetic Retinopathy without Macular Edema      HISTORY OF PRESENT ILLNESS: Gail Franco is a 66 y.o. female who presents to the clinic today for:   HPI   Pt reports much improved vision and clearing of floaters OS, over the last 2 weeks Last edited by Edmon Crape, MD on 09/06/2021  2:30 PM.      Referring physician: Corky Downs, MD [address] [place],  [place] [address]  HISTORICAL INFORMATION:   Selected notes from the MEDICAL RECORD NUMBER    Lab Results  Component Value Date   HGBA1C 7.2 01/24/2021     CURRENT MEDICATIONS: No current outpatient medications on file. (Ophthalmic Drugs)   No current facility-administered medications for this visit. (Ophthalmic Drugs)   Current Outpatient Medications (Other)  Medication Sig   acetaminophen (TYLENOL) 500 MG tablet Take 500 mg by mouth every 6 (six) hours as needed for pain.   carvedilol (COREG) 6.25 MG tablet Take 1 tablet (6.25 mg total) by mouth 2 (two) times daily with a meal.   clopidogrel (PLAVIX) 75 MG tablet TAKE 1 TABLET BY MOUTH DAILY. GENERIC EQUIVALENT FOR PLAVIX   cyclobenzaprine (FLEXERIL) 10 MG tablet Take 1 tablet (10 mg total) by mouth at bedtime.   FLUoxetine (PROZAC) 20 MG capsule Take 1 capsule (20 mg total) by mouth 2 (two) times daily.   Glucagon (BAQSIMI ONE PACK) 3 MG/DOSE POWD 3 mg by Left Nare route once as needed for up to 1 dose. For severe hypoglycemia with impaired consciousness   HUMALOG 100 UNIT/ML injection Inject into the skin 3 (three) times daily with meals. Per sliding scale   insulin degludec (TRESIBA FLEXTOUCH) 100 UNIT/ML FlexTouch Pen Inject into the skin.   lisinopril (ZESTRIL) 2.5 MG tablet Take 1 tablet (2.5 mg total) by mouth at bedtime.   pantoprazole (PROTONIX) 40 MG tablet Take 1 tablet (40 mg total) by mouth daily.   rosuvastatin (CRESTOR) 10 MG  tablet TAKE 1 TABLET BY MOUTH EVERY NIGHT AT BEDTIME   topiramate (TOPAMAX) 50 MG tablet Take 1 tablet (50 mg total) by mouth 2 (two) times daily.   No current facility-administered medications for this visit. (Other)      REVIEW OF SYSTEMS: ROS   Negative for: Constitutional, Gastrointestinal, Neurological, Skin, Genitourinary, Musculoskeletal, HENT, Endocrine, Cardiovascular, Eyes, Respiratory, Psychiatric, Allergic/Imm, Heme/Lymph Last edited by Edmon Crape, MD on 09/06/2021  2:25 PM.       ALLERGIES Allergies  Allergen Reactions   Other Itching    Steri strips     PAST MEDICAL HISTORY Past Medical History:  Diagnosis Date   Arthritis    Diffuse cystic mastopathy    Myocardial infarction (HCC)    PONV (postoperative nausea and vomiting)    always gets nauseated, needs Zofran   Thyroid disease 2008   no longer on medications   Past Surgical History:  Procedure Laterality Date   ABDOMINAL HYSTERECTOMY  1992   APPENDECTOMY     BREAST BIOPSY  1992   CARDIAC CATHETERIZATION  2010   CARPAL TUNNEL RELEASE     COLONOSCOPY  2010   CORONARY ARTERY BYPASS GRAFT  1995   EYE SURGERY Left    cataract with lens implant   EYE SURGERY Right 2018   cataract with lens implant   lumbar fusions  2010   LUMBAR WOUND DEBRIDEMENT N/A  02/09/2017   Procedure: LUMBAR WOUND REVISION WITH REMOVAL OF PALTE;  Surgeon: Tia Alert, MD;  Location: [facility] OR;  Service: Neurosurgery;  Laterality: N/A;   OTHER SURGICAL HISTORY  2011   scar revision d/t fistula lumbar fusion   SHOULDER SURGERY     UPPER GI ENDOSCOPY  2010, 2011    FAMILY HISTORY Family History  Problem Relation Age of Onset   Atrial fibrillation Mother    Heart disease Father     SOCIAL HISTORY Social History   Tobacco Use   Smoking status: Never   Smokeless tobacco: Never  Vaping Use   Vaping Use: Never used  Substance Use Topics   Alcohol use: No   Drug use: No         OPHTHALMIC EXAM:  Base Eye Exam      Visual Acuity (ETDRS)       Right Left   Dist [place] 20/25 -1 20/25 -1         Tonometry (Tonopen, 2:28 PM)       Right Left   Pressure 15 15         Pupils       Pupils APD   Right PERRL None   Left PERRL None         Visual Fields       Left Right    Full Full         Extraocular Movement       Right Left    Full, Ortho Full, Ortho         Neuro/Psych     Oriented x3: Yes   Mood/Affect: Normal         Dilation     Left eye: 1.0% Mydriacyl, 2.5% Phenylephrine @ 2:28 PM           Slit Lamp and Fundus Exam     External Exam       Right Left   External Normal Normal         Slit Lamp Exam       Right Left   Lids/Lashes Normal Normal   Conjunctiva/Sclera White and quiet White and quiet   Cornea Clear Clear   Anterior Chamber Deep and quiet Deep and quiet   Iris Round and reactive Round and reactive   Lens Posterior chamber intraocular lens Posterior chamber intraocular lens   Anterior Vitreous Posterior vitreous detachment Posterior vitreous detachment         Fundus Exam       Right Left   Posterior Vitreous  Posterior vitreous detachment or less defined by the inferior boat shaped hemorrhage inferior to the macula, Pre-retinal hemorrhage 1+   Disc  Normal   C/D Ratio declined dilation 0.4   Macula  Microaneurysms   Vessels  diabetic retinopathy OS PDR, active with preretinal vitreous hemorrhage   Periphery  Diabetic retinopathy, active with preretinal hemorrhage inferiorly, room anterior for more laser            IMAGING AND PROCEDURES  Imaging and Procedures for 09/06/21  Color Fundus Photography Optos - OU - Both Eyes       Right Eye Progression has been stable. Disc findings include normal observations. Macula : normal observations, microaneurysms.   Left Eye Progression has improved. Disc findings include normal observations. Macula : microaneurysms.   Notes Preretinal hemorrhages migrated inferiorly.   Visual axis has cleared.  No signs of active NVE or NVD D.  Most likely patient had a  small tractional change from a remnant of neovascular tissue posterior pole.  As this clears we will continue to observe              ASSESSMENT/PLAN:  Vitreous hemorrhage, left eye (HCC) Preretinal hemorrhage is moved out of the line of sight some 2 weeks post injection Avastin for PDR, remnant.  This could been a tractional vitreous hemorrhage of could have been a small neovascularization that bled due to other systemic factors.  In either case the visual axis is cleared nicely symptomatically and on clinical examination.  Continue to monitor and observe if condition worsens at any time patient may contact the office for follow-up in a nonemergent fashion and we will reevaluate and consider vitrectomy to clear the visual axis once and for all  Proliferative diabetic retinopathy of left eye determined by examination associated with type 1 diabetes mellitus (HCC) No signs of active NVE,     ICD-10-CM   1. Vitreous hemorrhage, left eye (HCC)  H43.12 Color Fundus Photography Optos - OU - Both Eyes    2. Proliferative diabetic retinopathy of left eye determined by examination associated with type 1 diabetes mellitus (HCC)  I34.7425 Color Fundus Photography Optos - OU - Both Eyes      1.  OS, vitreous hemorrhage clearing nicely with gravity but also 2 weeks post injection Avastin for PDR.  2.  Continue to monitor if symptoms worsen at any time patient may contact the office prior to next scheduled follow-up in 10 weeks.  3.  Ophthalmic Meds Ordered this visit:  No orders of the defined types were placed in this encounter.      Return in about 10 weeks (around 11/15/2021) for DILATE OU, COLOR FP, OCT.  There are no Patient Instructions on file for this visit.   Explained the diagnoses, plan, and follow up with the patient and they expressed understanding.  Patient expressed understanding of  the importance of proper follow up care.   Alford Highland Ashar Lewinski M.D. Diseases & Surgery of the Retina and Vitreous [facility] 09/06/21     Abbreviations: M myopia (nearsighted); A astigmatism; H hyperopia (farsighted); P presbyopia; Mrx spectacle prescription;  CTL contact lenses; OD right eye; OS left eye; OU both eyes  XT exotropia; ET esotropia; PEK punctate epithelial keratitis; PEE punctate epithelial erosions; DES dry eye syndrome; MGD meibomian gland dysfunction; ATs artificial tears; PFAT's preservative free artificial tears; NSC nuclear sclerotic cataract; PSC posterior subcapsular cataract; ERM epi-retinal membrane; PVD posterior vitreous detachment; RD retinal detachment; DM diabetes mellitus; DR diabetic retinopathy; NPDR non-proliferative diabetic retinopathy; PDR proliferative diabetic retinopathy; CSME clinically significant macular edema; DME diabetic macular edema; dbh dot blot hemorrhages; CWS cotton wool spot; POAG primary open angle glaucoma; C/D cup-to-disc ratio; HVF humphrey visual field; GVF goldmann visual field; OCT optical coherence tomography; IOP intraocular pressure; BRVO Branch retinal vein occlusion; CRVO central retinal vein occlusion; CRAO central retinal artery occlusion; BRAO branch retinal artery occlusion; RT retinal tear; SB scleral buckle; PPV pars plana vitrectomy; VH Vitreous hemorrhage; PRP panretinal laser photocoagulation; IVK intravitreal kenalog; VMT vitreomacular traction; MH Macular hole;  NVD neovascularization of the disc; NVE neovascularization elsewhere; AREDS age related eye disease study; ARMD age related macular degeneration; POAG primary open angle glaucoma; EBMD epithelial/anterior basement membrane dystrophy; ACIOL anterior chamber intraocular lens; IOL intraocular lens; PCIOL posterior chamber intraocular lens; Phaco/IOL phacoemulsification with intraocular lens placement; PRK photorefractive keratectomy; LASIK laser assisted in situ  keratomileusis; HTN hypertension; DM diabetes mellitus;  COPD chronic obstructive pulmonary disease

## 2021-09-07 ENCOUNTER — Other Ambulatory Visit: Payer: Self-pay | Admitting: *Deleted

## 2021-09-07 MED ORDER — CHERATUSSIN AC 100-10 MG/5ML PO SOLN
5.0000 mL | Freq: Three times a day (TID) | ORAL | 0 refills | Status: DC | PRN
Start: 2021-09-07 — End: 2022-01-12

## 2021-09-07 MED ORDER — METHYLPREDNISOLONE 4 MG PO TBPK: ORAL_TABLET | ORAL | 0 refills | Status: DC

## 2021-09-07 MED ORDER — AZITHROMYCIN 250 MG PO TABS: ORAL_TABLET | ORAL | 0 refills | Status: AC

## 2021-09-24 LAB — HM DIABETES EYE EXAM

## 2021-10-06 ENCOUNTER — Encounter (INDEPENDENT_AMBULATORY_CARE_PROVIDER_SITE_OTHER): Payer: Self-pay

## 2021-10-06 ENCOUNTER — Encounter: Payer: Self-pay | Admitting: Internal Medicine

## 2021-10-06 ENCOUNTER — Ambulatory Visit (INDEPENDENT_AMBULATORY_CARE_PROVIDER_SITE_OTHER): Payer: Medicare Other | Admitting: Internal Medicine

## 2021-10-06 VITALS — BP 125/78 | HR 84 | Ht 65.0 in | Wt 137.8 lb

## 2021-10-06 DIAGNOSIS — Z0000 Encounter for general adult medical examination without abnormal findings: Secondary | ICD-10-CM

## 2021-10-06 DIAGNOSIS — Z794 Long term (current) use of insulin: Secondary | ICD-10-CM

## 2021-10-06 DIAGNOSIS — R062 Wheezing: Secondary | ICD-10-CM

## 2021-10-06 DIAGNOSIS — I10 Essential (primary) hypertension: Secondary | ICD-10-CM

## 2021-10-06 DIAGNOSIS — J301 Allergic rhinitis due to pollen: Secondary | ICD-10-CM | POA: Diagnosis not present

## 2021-10-06 DIAGNOSIS — E119 Type 2 diabetes mellitus without complications: Secondary | ICD-10-CM

## 2021-10-06 DIAGNOSIS — T7840XA Allergy, unspecified, initial encounter: Secondary | ICD-10-CM

## 2021-10-06 DIAGNOSIS — E103591 Type 1 diabetes mellitus with proliferative diabetic retinopathy without macular edema, right eye: Secondary | ICD-10-CM

## 2021-10-06 LAB — GLUCOSE, POCT (MANUAL RESULT ENTRY): POC Glucose: 123 mg/dl — AB (ref 70–99)

## 2021-10-06 LAB — HM MAMMOGRAPHY: HM Mammogram: NORMAL (ref 0–4)

## 2021-10-06 MED ORDER — ALBUTEROL SULFATE HFA 108 (90 BASE) MCG/ACT IN AERS: 2.0000 | INHALATION_SPRAY | Freq: Four times a day (QID) | RESPIRATORY_TRACT | 0 refills | Status: DC | PRN

## 2021-10-06 MED ORDER — FLUTICASONE-SALMETEROL 100-50 MCG/ACT IN AEPB: 1.0000 | INHALATION_SPRAY | Freq: Two times a day (BID) | RESPIRATORY_TRACT | 3 refills | Status: DC

## 2021-10-06 MED ORDER — PANTOPRAZOLE SODIUM 40 MG PO TBEC: 40.0000 mg | DELAYED_RELEASE_TABLET | Freq: Every day | ORAL | 3 refills | Status: AC

## 2021-10-06 NOTE — Progress Notes (Signed)
Established Patient Office Visit  Subjective:  Patient ID: Gail Franco, female    DOB: 22-Apr-1955  Age: 66 y.o. MRN: [id number]  CC:  Chief Complaint  Patient presents with   Follow-up    HPI  Gail Franco presents for diabetes check  and wheezing  Past Medical History:  Diagnosis Date   Arthritis    Diffuse cystic mastopathy    Myocardial infarction (HCC)    PONV (postoperative nausea and vomiting)    always gets nauseated, needs Zofran   Thyroid disease 2008   no longer on medications    Past Surgical History:  Procedure Laterality Date   ABDOMINAL HYSTERECTOMY  1992   APPENDECTOMY     BREAST BIOPSY  1992   CARDIAC CATHETERIZATION  2010   CARPAL TUNNEL RELEASE     COLONOSCOPY  2010   CORONARY ARTERY BYPASS GRAFT  1995   EYE SURGERY Left    cataract with lens implant   EYE SURGERY Right 2018   cataract with lens implant   lumbar fusions  2010   LUMBAR WOUND DEBRIDEMENT N/A 02/09/2017   Procedure: LUMBAR WOUND REVISION WITH REMOVAL OF PALTE;  Surgeon: Tia Alert, MD;  Location: [facility] OR;  Service: Neurosurgery;  Laterality: N/A;   OTHER SURGICAL HISTORY  2011   scar revision d/t fistula lumbar fusion   SHOULDER SURGERY     UPPER GI ENDOSCOPY  2010, 2011    Family History  Problem Relation Age of Onset   Atrial fibrillation Mother    Heart disease Father     Social History   Socioeconomic History   Marital status: Married    Spouse name: Not on file   Number of children: Not on file   Years of education: Not on file   Highest education level: Not on file  Occupational History   Not on file  Tobacco Use   Smoking status: Never   Smokeless tobacco: Never  Vaping Use   Vaping Use: Never used  Substance and Sexual Activity   Alcohol use: No   Drug use: No   Sexual activity: Not on file  Other Topics Concern   Not on file  Social History Narrative   Not on file   Social Determinants of Health   Financial Resource Strain: Low Risk   (01/06/2021)   Overall Financial Resource Strain (CARDIA)    Difficulty of Paying Living Expenses: Not hard at all  Food Insecurity: No Food Insecurity (01/06/2021)   Hunger Vital Sign    Worried About Running Out of Food in the Last Year: Never true    Ran Out of Food in the Last Year: Never true  Transportation Needs: No Transportation Needs (01/06/2021)   PRAPARE - Administrator, Civil Service (Medical): No    Lack of Transportation (Non-Medical): No  Physical Activity: Sufficiently Active (01/06/2021)   Exercise Vital Sign    Days of Exercise per Week: 7 days    Minutes of Exercise per Session: 60 min  Stress: No Stress Concern Present (01/06/2021)   [employer] of Occupational Health - Occupational Stress Questionnaire    Feeling of Stress : Not at all  Social Connections: Socially Integrated (01/06/2021)   Social Connection and Isolation Panel [NHANES]    Frequency of Communication with Friends and Family: More than three times a week    Frequency of Social Gatherings with Friends and Family: More than three times a week    Attends Religious Services:  More than 4 times per year    Active Member of Clubs or Organizations: No    Attends Banker Meetings: More than 4 times per year    Marital Status: Married  Catering manager Violence: Not At Risk (01/06/2021)   Humiliation, Afraid, Rape, and Kick questionnaire    Fear of Current or Ex-Partner: No    Emotionally Abused: No    Physically Abused: No    Sexually Abused: No     Current Outpatient Medications:    acetaminophen (TYLENOL) 500 MG tablet, Take 500 mg by mouth every 6 (six) hours as needed for pain., Disp: , Rfl:    albuterol (VENTOLIN HFA) 108 (90 Base) MCG/ACT inhaler, Inhale 2 puffs into the lungs every 6 (six) hours as needed for wheezing or shortness of breath., Disp: 8 g, Rfl: 0   carvedilol (COREG) 6.25 MG tablet, Take 1 tablet (6.25 mg total) by mouth 2 (two) times daily with a  meal., Disp: 180 tablet, Rfl: 3   clopidogrel (PLAVIX) 75 MG tablet, TAKE 1 TABLET BY MOUTH DAILY. GENERIC EQUIVALENT FOR PLAVIX, Disp: 90 tablet, Rfl: 3   cyclobenzaprine (FLEXERIL) 10 MG tablet, Take 1 tablet (10 mg total) by mouth at bedtime., Disp: 90 tablet, Rfl: 3   FLUoxetine (PROZAC) 20 MG capsule, Take 1 capsule (20 mg total) by mouth 2 (two) times daily., Disp: 180 capsule, Rfl: 3   fluticasone-salmeterol (ADVAIR) 100-50 MCG/ACT AEPB, Inhale 1 puff into the lungs 2 (two) times daily., Disp: 1 each, Rfl: 3   Glucagon (BAQSIMI ONE PACK) 3 MG/DOSE POWD, 3 mg by Left Nare route once as needed for up to 1 dose. For severe hypoglycemia with impaired consciousness, Disp: , Rfl:    guaiFENesin-codeine (CHERATUSSIN AC) 100-10 MG/5ML syrup, Take 5 mLs by mouth 3 (three) times daily as needed for cough., Disp: 120 mL, Rfl: 0   HUMALOG 100 UNIT/ML injection, Inject into the skin 3 (three) times daily with meals. Per sliding scale, Disp: , Rfl:    insulin degludec (TRESIBA FLEXTOUCH) 100 UNIT/ML FlexTouch Pen, Inject into the skin., Disp: , Rfl:    lisinopril (ZESTRIL) 2.5 MG tablet, Take 1 tablet (2.5 mg total) by mouth at bedtime., Disp: 90 tablet, Rfl: 3   methylPREDNISolone (MEDROL DOSEPAK) 4 MG TBPK tablet, As directed, Disp: 21 each, Rfl: 0   rosuvastatin (CRESTOR) 10 MG tablet, TAKE 1 TABLET BY MOUTH EVERY NIGHT AT BEDTIME, Disp: 90 tablet, Rfl: 3   topiramate (TOPAMAX) 50 MG tablet, Take 1 tablet (50 mg total) by mouth 2 (two) times daily., Disp: 180 tablet, Rfl: 3   pantoprazole (PROTONIX) 40 MG tablet, Take 1 tablet (40 mg total) by mouth daily., Disp: 90 tablet, Rfl: 3   Allergies  Allergen Reactions   Other Itching    Steri strips     ROS Review of Systems  Constitutional: Negative.   HENT: Negative.    Eyes: Negative.   Respiratory:  Positive for cough, chest tightness, shortness of breath and wheezing.   Cardiovascular: Negative.   Gastrointestinal: Negative.   Endocrine:  Negative.   Genitourinary: Negative.   Musculoskeletal: Negative.   Skin: Negative.   Allergic/Immunologic: Negative.   Neurological: Negative.   Hematological: Negative.   Psychiatric/Behavioral: Negative.    All other systems reviewed and are negative.     Objective:    Physical Exam Vitals reviewed.  Constitutional:      Appearance: Normal appearance.  HENT:     Mouth/Throat:     Mouth:  Mucous membranes are moist.  Eyes:     Pupils: Pupils are equal, round, and reactive to light.  Neck:     Vascular: No carotid bruit.  Cardiovascular:     Rate and Rhythm: Normal rate and regular rhythm.     Pulses: Normal pulses.     Heart sounds: Normal heart sounds.  Pulmonary:     Effort: Pulmonary effort is normal.     Breath sounds: Wheezing present.  Abdominal:     General: Bowel sounds are normal.     Palpations: Abdomen is soft. There is no hepatomegaly, splenomegaly or mass.     Tenderness: There is no abdominal tenderness.     Hernia: No hernia is present.  Musculoskeletal:        General: No tenderness.     Cervical back: Neck supple.     Right lower leg: No edema.     Left lower leg: No edema.  Skin:    Findings: No rash.  Neurological:     Mental Status: She is alert and oriented to person, place, and time.     Motor: No weakness.  Psychiatric:        Mood and Affect: Mood and affect normal.        Behavior: Behavior normal.     BP 125/78   Pulse 84   Ht 5\' 5"  (1.651 m)   Wt 137 lb 12.8 oz (62.5 kg)   BMI 22.93 kg/m  Wt Readings from Last 3 Encounters:  10/06/21 137 lb 12.8 oz (62.5 kg)  06/02/21 147 lb 14.4 oz (67.1 kg)  02/03/21 145 lb (65.8 kg)     Health Maintenance Due  Topic Date Due   Hepatitis C Screening  Never done   Zoster Vaccines- Shingrix (1 of 2) Never done   Diabetic kidney evaluation - GFR measurement  02/09/2018   Diabetic kidney evaluation - Urine ACR  10/26/2018   COVID-19 Vaccine (3 - Pfizer series) 05/04/2019   FOOT EXAM   07/24/2021   HEMOGLOBIN A1C  07/24/2021   INFLUENZA VACCINE  08/24/2021   Pneumonia Vaccine 67+ Years old (2 - PCV) 10/24/2021    There are no preventive care reminders to display for this patient.  No results found for: "TSH" Lab Results  Component Value Date   WBC 6.9 02/09/2017   HGB 13.1 02/09/2017   HCT 40.6 02/09/2017   MCV 96.7 02/09/2017   PLT 272 02/09/2017   Lab Results  Component Value Date   NA 139 02/09/2017   K 4.9 02/09/2017   CO2 18 (L) 02/09/2017   GLUCOSE 118 (H) 02/09/2017   BUN 25 (H) 02/09/2017   CREATININE 1.03 (H) 02/09/2017   CALCIUM 9.9 02/09/2017   ANIONGAP 13 02/09/2017   No results found for: "CHOL" No results found for: "HDL" No results found for: "LDLCALC" No results found for: "TRIG" No results found for: "CHOLHDL" Lab Results  Component Value Date   HGBA1C 7.2 01/24/2021      Assessment & Plan:   Problem List Items Addressed This Visit       Cardiovascular and Mediastinum   Essential hypertension     Patient denies any chest pain or shortness of breath there is no history of palpitation or paroxysmal nocturnal dyspnea   patient was advised to follow low-salt low-cholesterol diet    ideally I want to keep systolic blood pressure below 03/24/2021 mmHg, patient was asked to check blood pressure one times a week and give me a  report on that.  Patient will be follow-up in 3 months  or earlier as needed, patient will call me back for any change in the cardiovascular symptoms Patient was advised to buy a book from local bookstore concerning blood pressure and read several chapters  every day.  This will be supplemented by some of the material we will give him from the office.  Patient should also utilize other resources like YouTube and Internet to learn more about the blood pressure and the diet.        Respiratory   Seasonal allergic rhinitis due to pollen    Take Claritin 10 mg p.o. daily        Endocrine   Diabetic maculopathy of  right eye with proliferative retinopathy determined by examination associated with type 1 diabetes mellitus (HCC)    Refer to ophthalmologist      Other Visit Diagnoses     Type 2 diabetes mellitus without complication, with long-term current use of insulin (HCC)    -  Primary   Relevant Orders   POCT glucose (manual entry) (Completed)   Wheezing due to allergy       Relevant Medications   albuterol (VENTOLIN HFA) 108 (90 Base) MCG/ACT inhaler   fluticasone-salmeterol (ADVAIR) 100-50 MCG/ACT AEPB       Meds ordered this encounter  Medications   pantoprazole (PROTONIX) 40 MG tablet    Sig: Take 1 tablet (40 mg total) by mouth daily.    Dispense:  90 tablet    Refill:  3   albuterol (VENTOLIN HFA) 108 (90 Base) MCG/ACT inhaler    Sig: Inhale 2 puffs into the lungs every 6 (six) hours as needed for wheezing or shortness of breath.    Dispense:  8 g    Refill:  0   fluticasone-salmeterol (ADVAIR) 100-50 MCG/ACT AEPB    Sig: Inhale 1 puff into the lungs 2 (two) times daily.    Dispense:  1 each    Refill:  3    Follow-up: No follow-ups on file.    Corky Downs, MD

## 2021-10-06 NOTE — Assessment & Plan Note (Signed)
Refer to ophthalmologist 

## 2021-10-06 NOTE — Assessment & Plan Note (Signed)
Take Claritin 10 mg p.o. daily 

## 2021-10-06 NOTE — Assessment & Plan Note (Signed)

## 2021-10-07 ENCOUNTER — Other Ambulatory Visit: Payer: Self-pay | Admitting: *Deleted

## 2021-10-07 ENCOUNTER — Encounter: Payer: Self-pay | Admitting: *Deleted

## 2021-10-07 DIAGNOSIS — T7840XA Allergy, unspecified, initial encounter: Secondary | ICD-10-CM

## 2021-10-13 ENCOUNTER — Other Ambulatory Visit: Payer: Self-pay | Admitting: *Deleted

## 2021-10-14 ENCOUNTER — Other Ambulatory Visit: Payer: Self-pay | Admitting: *Deleted

## 2021-10-14 MED ORDER — DICYCLOMINE HCL 10 MG PO CAPS: 10.0000 mg | ORAL_CAPSULE | Freq: Three times a day (TID) | ORAL | 0 refills | Status: DC

## 2021-10-15 ENCOUNTER — Other Ambulatory Visit: Payer: Self-pay | Admitting: *Deleted

## 2021-10-15 MED ORDER — DICYCLOMINE HCL 10 MG PO CAPS: 10.0000 mg | ORAL_CAPSULE | Freq: Three times a day (TID) | ORAL | 0 refills | Status: DC

## 2021-10-19 ENCOUNTER — Other Ambulatory Visit: Payer: Self-pay | Admitting: Internal Medicine

## 2021-11-15 ENCOUNTER — Encounter (INDEPENDENT_AMBULATORY_CARE_PROVIDER_SITE_OTHER): Payer: Self-pay

## 2021-11-15 ENCOUNTER — Encounter (INDEPENDENT_AMBULATORY_CARE_PROVIDER_SITE_OTHER): Payer: Medicare Other | Admitting: Ophthalmology

## 2021-12-09 ENCOUNTER — Telehealth: Payer: Self-pay | Admitting: Internal Medicine

## 2021-12-09 NOTE — Telephone Encounter (Signed)
R/S AWV ON 01/12/2022. I attempted to lm for pt to r/s awv to phone call w/ [facility]-no vm. If pt calls office, please transfer call to me jabber #[phone].

## 2022-01-10 ENCOUNTER — Encounter (INDEPENDENT_AMBULATORY_CARE_PROVIDER_SITE_OTHER): Payer: Medicare Other | Admitting: Ophthalmology

## 2022-01-12 ENCOUNTER — Encounter: Payer: Self-pay | Admitting: Internal Medicine

## 2022-01-12 ENCOUNTER — Ambulatory Visit (INDEPENDENT_AMBULATORY_CARE_PROVIDER_SITE_OTHER): Payer: Medicare Other | Admitting: Internal Medicine

## 2022-01-12 ENCOUNTER — Encounter (INDEPENDENT_AMBULATORY_CARE_PROVIDER_SITE_OTHER): Payer: Medicare Other | Admitting: Ophthalmology

## 2022-01-12 VITALS — BP 108/62 | HR 74 | Ht 65.0 in | Wt 139.8 lb

## 2022-01-12 DIAGNOSIS — J301 Allergic rhinitis due to pollen: Secondary | ICD-10-CM | POA: Diagnosis not present

## 2022-01-12 DIAGNOSIS — E11319 Type 2 diabetes mellitus with unspecified diabetic retinopathy without macular edema: Secondary | ICD-10-CM | POA: Diagnosis not present

## 2022-01-12 DIAGNOSIS — E119 Type 2 diabetes mellitus without complications: Secondary | ICD-10-CM

## 2022-01-12 DIAGNOSIS — H35043 Retinal micro-aneurysms, unspecified, bilateral: Secondary | ICD-10-CM | POA: Diagnosis not present

## 2022-01-12 DIAGNOSIS — I10 Essential (primary) hypertension: Secondary | ICD-10-CM | POA: Diagnosis not present

## 2022-01-12 DIAGNOSIS — Z794 Long term (current) use of insulin: Secondary | ICD-10-CM

## 2022-01-12 DIAGNOSIS — Z0000 Encounter for general adult medical examination without abnormal findings: Secondary | ICD-10-CM

## 2022-01-12 LAB — POCT GLYCOSYLATED HEMOGLOBIN (HGB A1C): Hemoglobin A1C: 6.6 % — AB (ref 4.0–5.6)

## 2022-01-12 NOTE — Assessment & Plan Note (Signed)
Refer to the retina doctor

## 2022-01-12 NOTE — Addendum Note (Signed)
Addended by: Jama Flavors on: 01/12/2022 11:40 AM   Modules accepted: Orders

## 2022-01-12 NOTE — Assessment & Plan Note (Signed)
Take Claritin 5 mg p.o. daily 

## 2022-01-12 NOTE — Assessment & Plan Note (Signed)
Blood pressure is under control, patient does not have any chest pain

## 2022-01-12 NOTE — Assessment & Plan Note (Signed)
Blood sugars under control heart is regular chest is clear abdomen is soft nontender without any hepatosplenomegaly.  There is no pedal edema no calf tenderness.  Patient has a rotator cuff problem.  Diabetes is under control.  Blood pressure is also under control.

## 2022-01-12 NOTE — Progress Notes (Signed)
Established Patient Office Visit  Subjective:  Patient ID: Gail Franco, female    DOB: 1955-04-25  Age: 66 y.o. MRN: [id number]  CC:  Chief Complaint  Patient presents with   Medicare Wellness    HPI  JENILYN MAGANA presents for check up.  D.  And he is doing wonderful years.  But he is still 1  He does not take any medicine for in [place] and with  Common symptoms ofht attack condition include: 1. Chest pain. The pain may last a long time, or it may stop and come back (recur). It may feel like:  o Crushing or squeezing.  o Tightness, pressure, fullness, or heaviness. 2. Arm, neck, jaw, or back pain. 3. Heartburn or indigestion. 4. Shortness of breath. 5. Nausea. 6. Sudden cold sweats. 7. Light-headedness. 8. Dizziness or passing out. 9. Tiredness (fatigue).  The leg.   X 4 and she is  She  Coming around.  From: Dr. Roddie Mc 46  Dr. Eyvonne Mechanic I have been seeing him for 30 to 40 years and he told me he slipped  I have a lot of problem with left eval  96 He said- I encouraged the patient to lose weight.  - I educated them on making healthy dietary choices including eating more fruits and vegetables and less fried foods. - I encouraged the patient to exercise more, and educated on the benefits of exercise including weight loss, diabetes prevention, and hypertension prevention.   Dietary counseling with a registered dietician  Referral to a weight management support group (e.g. Weight Watchers, Overeaters Anonymous)  If your BMI is greater than 29 or you have gained more than 15 pounds you should work on weight loss.  Attend a healthy cooking class does not think he is  Past Medical History:  Diagnosis Date   Arthritis    Diffuse cystic mastopathy    Myocardial infarction ([place])    PONV (postoperative nausea and vomiting)    always gets nauseated, needs Zofran   Thyroid disease 2008   no longer on medications    Past Surgical History:   Procedure Laterality Date   ABDOMINAL HYSTERECTOMY  1992   APPENDECTOMY     BREAST BIOPSY  1992   CARDIAC CATHETERIZATION  2010   CARPAL TUNNEL RELEASE     COLONOSCOPY  2010   CORONARY ARTERY BYPASS GRAFT  1995   EYE SURGERY Left    cataract with lens implant   EYE SURGERY Right 2018   cataract with lens implant   lumbar fusions  2010   LUMBAR WOUND DEBRIDEMENT N/A 02/09/2017   Procedure: [place];  Surgeon: Eustace Moore, MD;  Location: [facility];  Service: Neurosurgery;  Laterality: N/A;   OTHER SURGICAL HISTORY  2011   scar revision d/t fistula lumbar fusion   SHOULDER SURGERY     UPPER GI ENDOSCOPY  2010, 2011    Family History  Problem Relation Age of Onset   Atrial fibrillation Mother    Heart disease Father     Social History   Socioeconomic History   Marital status: Married    Spouse name: Not on file   Number of children: Not on file   Years of education: Not on file   Highest education level: Not on file  Occupational History   Not on file  Tobacco Use   Smoking status: Never   Smokeless tobacco: Never  Vaping Use   Vaping  Use: Never used  Substance and Sexual Activity   Alcohol use: No   Drug use: No   Sexual activity: Not on file  Other Topics Concern   Not on file  Social History Narrative   Not on file   Social Determinants of Health   Financial Resource Strain: Low Risk  (01/06/2021)   Overall Financial Resource Strain (CARDIA)    Difficulty of Paying Living Expenses: Not hard at all  Food Insecurity: No Food Insecurity (01/06/2021)   Hunger Vital Sign    Worried About Running Out of Food in the Last Year: Never true    Ran Out of Food in the Last Year: Never true  Transportation Needs: No Transportation Needs (01/06/2021)   PRAPARE - Hydrologist (Medical): No    Lack of Transportation (Non-Medical): No  Physical Activity: Sufficiently Active (01/06/2021)   Exercise Vital Sign     Days of Exercise per Week: 7 days    Minutes of Exercise per Session: 60 min  Stress: No Stress Concern Present (01/06/2021)   [place]    Feeling of Stress : Not at all  Social Connections: [place] (01/06/2021)   Social Connection and Isolation Panel [NHANES]    Frequency of Communication with Friends and Family: More than three times a week    Frequency of Social Gatherings with Friends and Family: More than three times a week    Attends Religious Services: More than 4 times per year    Active Member of [employer] or Organizations: No    Attends Music therapist: More than 4 times per year    Marital Status: Married  Human resources officer Violence: Not At Risk (01/06/2021)   Humiliation, Afraid, Rape, and Kick questionnaire    Fear of Current or Ex-Partner: No    Emotionally Abused: No    Physically Abused: No    Sexually Abused: No     Current Outpatient Medications:    acetaminophen (TYLENOL) 500 MG tablet, Take 500 mg by mouth every 6 (six) hours as needed for pain., Disp: , Rfl:    carvedilol (COREG) 6.25 MG tablet, Take 1 tablet (6.25 mg total) by mouth 2 (two) times daily with a meal., Disp: 180 tablet, Rfl: 3   clopidogrel (PLAVIX) 75 MG tablet, TAKE 1 TABLET BY MOUTH DAILY. GENERIC EQUIVALENT FOR PLAVIX, Disp: 90 tablet, Rfl: 3   cyclobenzaprine (FLEXERIL) 10 MG tablet, Take 1 tablet (10 mg total) by mouth at bedtime., Disp: 90 tablet, Rfl: 3   FLUoxetine (PROZAC) 20 MG capsule, Take 1 capsule (20 mg total) by mouth 2 (two) times daily., Disp: 180 capsule, Rfl: 3   HUMALOG 100 UNIT/ML injection, Inject into the skin 3 (three) times daily with meals. Per sliding scale, Disp: , Rfl:    insulin degludec (TRESIBA FLEXTOUCH) 100 UNIT/ML FlexTouch Pen, Inject into the skin., Disp: , Rfl:    lisinopril (ZESTRIL) 2.5 MG tablet, Take 1 tablet (2.5 mg total) by mouth at bedtime., Disp: 90 tablet, Rfl:  3   pantoprazole (PROTONIX) 40 MG tablet, Take 1 tablet (40 mg total) by mouth daily., Disp: 90 tablet, Rfl: 3   rosuvastatin (CRESTOR) 10 MG tablet, TAKE 1 TABLET BY MOUTH EVERY NIGHT AT BEDTIME, Disp: 90 tablet, Rfl: 3   topiramate (TOPAMAX) 50 MG tablet, Take 1 tablet (50 mg total) by mouth 2 (two) times daily., Disp: 180 tablet, Rfl: 3   Allergies  Allergen Reactions  Other Itching    Steri strips     ROS Review of Systems  Constitutional: Negative.   HENT: Negative.    Eyes: Negative.   Respiratory: Negative.    Cardiovascular: Negative.   Gastrointestinal: Negative.   Endocrine: Negative.   Genitourinary: Negative.   Musculoskeletal: Negative.   Skin: Negative.   Allergic/Immunologic: Negative.   Neurological: Negative.   Hematological: Negative.   Psychiatric/Behavioral: Negative.    All other systems reviewed and are negative.     Objective:    Physical Exam Vitals reviewed.  Constitutional:      Appearance: Normal appearance.  HENT:     Mouth/Throat:     Mouth: Mucous membranes are moist.  Eyes:     Pupils: Pupils are equal, round, and reactive to light.  Neck:     Vascular: No carotid bruit.  Cardiovascular:     Rate and Rhythm: Normal rate and regular rhythm.     Pulses: Normal pulses.     Heart sounds: Normal heart sounds.  Pulmonary:     Effort: Pulmonary effort is normal.     Breath sounds: Normal breath sounds.  Abdominal:     General: Bowel sounds are normal.     Palpations: Abdomen is soft. There is no hepatomegaly, splenomegaly or mass.     Tenderness: There is no abdominal tenderness.     Hernia: No hernia is present.  Musculoskeletal:        General: No tenderness.     Cervical back: Neck supple.     Right lower leg: No edema.     Left lower leg: No edema.  Skin:    Findings: No rash.  Neurological:     Mental Status: She is alert and oriented to person, place, and time.     Motor: No weakness.  Psychiatric:        Mood and  Affect: Mood and affect normal.        Behavior: Behavior normal.     BP 108/62   Pulse 74   Ht _0  (1.651 m)   Wt 139 lb 12.8 oz (63.4 kg)   SpO2 98%   BMI 23.26 kg/m  Wt Readings from Last 3 Encounters:  01/12/22 139 lb 12.8 oz (63.4 kg)  10/06/21 137 lb 12.8 oz (62.5 kg)  06/02/21 147 lb 14.4 oz (67.1 kg)     Health Maintenance Due  Topic Date Due   Diabetic kidney evaluation - Urine ACR  Never done   Hepatitis C Screening  Never done   Diabetic kidney evaluation - eGFR measurement  02/09/2018   DEXA SCAN  Never done   FOOT EXAM  07/24/2021   Medicare Annual Wellness (AWV)  01/06/2022   COLONOSCOPY (Pts 45-31yr Insurance coverage will need to be confirmed)  01/12/2022    There are no preventive care reminders to display for this patient.  No results found for: "TSH" Lab Results  Component Value Date   WBC 6.9 02/09/2017   HGB 13.1 02/09/2017   HCT 40.6 02/09/2017   MCV 96.7 02/09/2017   PLT 272 02/09/2017   Lab Results  Component Value Date   NA 139 02/09/2017   K 4.9 02/09/2017   CO2 18 (L) 02/09/2017   GLUCOSE 118 (H) 02/09/2017   BUN 25 (H) 02/09/2017   CREATININE 1.03 (H) 02/09/2017   CALCIUM 9.9 02/09/2017   ANIONGAP 13 02/09/2017   No results found for: "CHOL" No results found for: "HDL" No results found for: "LDLCALC" No  results found for: "TRIG" No results found for: "CHOLHDL" Lab Results  Component Value Date   HGBA1C 6.6 (A) 01/12/2022      Assessment & Plan:   Problem List Items Addressed This Visit       Cardiovascular and Mediastinum   Retinal microaneurysm of both eyes    Refer to the retina doctor      Essential hypertension    Blood pressure is under control, patient does not have any chest pain        Respiratory   Seasonal allergic rhinitis due to pollen    Take Claritin 5 mg p.o. daily        Other   Physical exam, annual    Blood sugars under control heart is regular chest is clear abdomen is soft  nontender without any hepatosplenomegaly.  There is no pedal edema no calf tenderness.  Patient has a rotator cuff problem.  Diabetes is under control.  Blood pressure is also under control.      Other Visit Diagnoses     Type 2 diabetes mellitus without complication, with long-term current use of insulin ([place])    -  Primary   Relevant Orders   POCT glycosylated hemoglobin (Hb A1C) (Completed)       No orders of the defined types were placed in this encounter.   Follow-up: No follow-ups on file.    Cletis Athens, MD

## 2022-01-13 LAB — COMPLETE METABOLIC PANEL WITH GFR
AG Ratio: 2 (calc) (ref 1.0–2.5)
ALT: 13 U/L (ref 6–29)
AST: 16 U/L (ref 10–35)
Albumin: 4.5 g/dL (ref 3.6–5.1)
Alkaline phosphatase (APISO): 61 U/L (ref 37–153)
BUN/Creatinine Ratio: 31 (calc) — ABNORMAL HIGH (ref 6–22)
BUN: 33 mg/dL — ABNORMAL HIGH (ref 7–25)
CO2: 28 mmol/L (ref 20–32)
Calcium: 10.6 mg/dL — ABNORMAL HIGH (ref 8.6–10.4)
Chloride: 105 mmol/L (ref 98–110)
Creat: 1.08 mg/dL — ABNORMAL HIGH (ref 0.50–1.05)
Globulin: 2.3 g/dL (calc) (ref 1.9–3.7)
Glucose, Bld: 66 mg/dL (ref 65–99)
Potassium: 4.8 mmol/L (ref 3.5–5.3)
Sodium: 140 mmol/L (ref 135–146)
Total Bilirubin: 0.3 mg/dL (ref 0.2–1.2)
Total Protein: 6.8 g/dL (ref 6.1–8.1)
eGFR: 56 mL/min/{1.73_m2} — ABNORMAL LOW (ref >=60)

## 2022-01-13 LAB — CBC WITH DIFFERENTIAL/PLATELET
Absolute Monocytes: 525 cells/uL (ref 200–950)
Basophils Absolute: 90 cells/uL (ref 0–200)
Basophils Relative: 1.4 %
Eosinophils Absolute: 154 cells/uL (ref 15–500)
Eosinophils Relative: 2.4 %
HCT: 40.9 % (ref 35.0–45.0)
Hemoglobin: 13.7 g/dL (ref 11.7–15.5)
Lymphs Abs: 1171 cells/uL (ref 850–3900)
MCH: 32.2 pg (ref 27.0–33.0)
MCHC: 33.5 g/dL (ref 32.0–36.0)
MCV: 96.2 fL (ref 80.0–100.0)
MPV: 10.2 fL (ref 7.5–12.5)
Monocytes Relative: 8.2 %
Neutro Abs: 4461 cells/uL (ref 1500–7800)
Neutrophils Relative %: 69.7 %
Platelets: 288 10*3/uL (ref 140–400)
RBC: 4.25 10*6/uL (ref 3.80–5.10)
RDW: 11.6 % (ref 11.0–15.0)
Total Lymphocyte: 18.3 %
WBC: 6.4 10*3/uL (ref 3.8–10.8)

## 2022-01-13 LAB — LIPID PANEL
Cholesterol: 151 mg/dL (ref <200)
HDL: 77 mg/dL (ref >=50)
LDL Cholesterol (Calc): 60 mg/dL (calc)
Non-HDL Cholesterol (Calc): 74 mg/dL (calc) (ref <130)
Total CHOL/HDL Ratio: 2 (calc) (ref <5.0)
Triglycerides: 55 mg/dL (ref <150)

## 2022-01-13 LAB — TSH: TSH: 1.77 mIU/L (ref 0.40–4.50)

## 2022-01-25 ENCOUNTER — Other Ambulatory Visit: Payer: Self-pay

## 2022-01-25 MED ORDER — FLUOXETINE HCL 20 MG PO CAPS: 20.0000 mg | ORAL_CAPSULE | Freq: Two times a day (BID) | ORAL | 0 refills | Status: AC
